# Patient Record
Sex: Male | Born: 1935 | Race: White | Hispanic: No | Marital: Married | Smoking: Former smoker
Health system: Southern US, Community
[De-identification: ages and names within clinical notes are randomized; demographics above are authoritative.]

## PROBLEM LIST (undated history)

## (undated) DIAGNOSIS — I10 Essential (primary) hypertension: Secondary | ICD-10-CM

## (undated) DIAGNOSIS — N400 Enlarged prostate without lower urinary tract symptoms: Secondary | ICD-10-CM

## (undated) DIAGNOSIS — M4800 Spinal stenosis, site unspecified: Secondary | ICD-10-CM

## (undated) DIAGNOSIS — I6529 Occlusion and stenosis of unspecified carotid artery: Secondary | ICD-10-CM

## (undated) DIAGNOSIS — I4819 Other persistent atrial fibrillation: Secondary | ICD-10-CM

## (undated) DIAGNOSIS — G5600 Carpal tunnel syndrome, unspecified upper limb: Secondary | ICD-10-CM

## (undated) HISTORY — PX: ROTATOR CUFF REPAIR: SHX139

## (undated) HISTORY — PX: BACK SURGERY: SHX140

## (undated) HISTORY — PX: CARPAL TUNNEL RELEASE: SHX101

## (undated) HISTORY — PX: CAROTID ARTERY - SUBCLAVIAN ARTERY BYPASS GRAFT: SUR178

## (undated) HISTORY — PX: CATARACT EXTRACTION: SUR2

---

## 2016-11-03 DIAGNOSIS — D045 Carcinoma in situ of skin of trunk: Secondary | ICD-10-CM | POA: Diagnosis not present

## 2016-11-03 DIAGNOSIS — L821 Other seborrheic keratosis: Secondary | ICD-10-CM | POA: Diagnosis not present

## 2016-11-03 DIAGNOSIS — Z08 Encounter for follow-up examination after completed treatment for malignant neoplasm: Secondary | ICD-10-CM | POA: Diagnosis not present

## 2016-11-03 DIAGNOSIS — L918 Other hypertrophic disorders of the skin: Secondary | ICD-10-CM | POA: Diagnosis not present

## 2016-11-03 DIAGNOSIS — L570 Actinic keratosis: Secondary | ICD-10-CM | POA: Diagnosis not present

## 2016-11-03 DIAGNOSIS — L814 Other melanin hyperpigmentation: Secondary | ICD-10-CM | POA: Diagnosis not present

## 2016-11-03 DIAGNOSIS — R21 Rash and other nonspecific skin eruption: Secondary | ICD-10-CM | POA: Diagnosis not present

## 2016-11-03 DIAGNOSIS — D2372 Other benign neoplasm of skin of left lower limb, including hip: Secondary | ICD-10-CM | POA: Diagnosis not present

## 2016-11-03 DIAGNOSIS — Z85828 Personal history of other malignant neoplasm of skin: Secondary | ICD-10-CM | POA: Diagnosis not present

## 2016-11-03 DIAGNOSIS — C44329 Squamous cell carcinoma of skin of other parts of face: Secondary | ICD-10-CM | POA: Diagnosis not present

## 2016-11-03 DIAGNOSIS — D485 Neoplasm of uncertain behavior of skin: Secondary | ICD-10-CM | POA: Diagnosis not present

## 2016-12-01 DIAGNOSIS — E782 Mixed hyperlipidemia: Secondary | ICD-10-CM | POA: Diagnosis not present

## 2016-12-04 DIAGNOSIS — I480 Paroxysmal atrial fibrillation: Secondary | ICD-10-CM | POA: Diagnosis not present

## 2016-12-04 DIAGNOSIS — E782 Mixed hyperlipidemia: Secondary | ICD-10-CM | POA: Diagnosis not present

## 2016-12-04 DIAGNOSIS — I10 Essential (primary) hypertension: Secondary | ICD-10-CM | POA: Diagnosis not present

## 2016-12-08 DIAGNOSIS — I10 Essential (primary) hypertension: Secondary | ICD-10-CM | POA: Diagnosis not present

## 2016-12-08 DIAGNOSIS — I480 Paroxysmal atrial fibrillation: Secondary | ICD-10-CM | POA: Diagnosis not present

## 2016-12-08 DIAGNOSIS — F411 Generalized anxiety disorder: Secondary | ICD-10-CM | POA: Diagnosis not present

## 2016-12-08 DIAGNOSIS — Z6825 Body mass index (BMI) 25.0-25.9, adult: Secondary | ICD-10-CM | POA: Diagnosis not present

## 2016-12-08 DIAGNOSIS — Z79899 Other long term (current) drug therapy: Secondary | ICD-10-CM | POA: Diagnosis not present

## 2016-12-08 DIAGNOSIS — E782 Mixed hyperlipidemia: Secondary | ICD-10-CM | POA: Diagnosis not present

## 2016-12-08 DIAGNOSIS — I739 Peripheral vascular disease, unspecified: Secondary | ICD-10-CM | POA: Diagnosis not present

## 2016-12-11 DIAGNOSIS — C44329 Squamous cell carcinoma of skin of other parts of face: Secondary | ICD-10-CM | POA: Diagnosis not present

## 2016-12-18 DIAGNOSIS — C44329 Squamous cell carcinoma of skin of other parts of face: Secondary | ICD-10-CM | POA: Diagnosis not present

## 2016-12-24 DIAGNOSIS — C44311 Basal cell carcinoma of skin of nose: Secondary | ICD-10-CM | POA: Diagnosis not present

## 2016-12-24 DIAGNOSIS — D485 Neoplasm of uncertain behavior of skin: Secondary | ICD-10-CM | POA: Diagnosis not present

## 2016-12-24 DIAGNOSIS — C44529 Squamous cell carcinoma of skin of other part of trunk: Secondary | ICD-10-CM | POA: Diagnosis not present

## 2017-03-30 DIAGNOSIS — M5136 Other intervertebral disc degeneration, lumbar region: Secondary | ICD-10-CM | POA: Diagnosis not present

## 2017-03-30 DIAGNOSIS — M961 Postlaminectomy syndrome, not elsewhere classified: Secondary | ICD-10-CM | POA: Diagnosis not present

## 2017-03-30 DIAGNOSIS — M461 Sacroiliitis, not elsewhere classified: Secondary | ICD-10-CM | POA: Diagnosis not present

## 2017-03-30 DIAGNOSIS — Z79899 Other long term (current) drug therapy: Secondary | ICD-10-CM | POA: Diagnosis not present

## 2017-04-15 DIAGNOSIS — Z23 Encounter for immunization: Secondary | ICD-10-CM | POA: Diagnosis not present

## 2017-04-30 DIAGNOSIS — C44311 Basal cell carcinoma of skin of nose: Secondary | ICD-10-CM | POA: Diagnosis not present

## 2017-05-28 DIAGNOSIS — Z48817 Encounter for surgical aftercare following surgery on the skin and subcutaneous tissue: Secondary | ICD-10-CM | POA: Diagnosis not present

## 2017-05-29 DIAGNOSIS — E782 Mixed hyperlipidemia: Secondary | ICD-10-CM | POA: Diagnosis not present

## 2017-05-29 DIAGNOSIS — Z79899 Other long term (current) drug therapy: Secondary | ICD-10-CM | POA: Diagnosis not present

## 2017-06-03 DIAGNOSIS — I6529 Occlusion and stenosis of unspecified carotid artery: Secondary | ICD-10-CM | POA: Diagnosis not present

## 2017-06-03 DIAGNOSIS — I10 Essential (primary) hypertension: Secondary | ICD-10-CM | POA: Diagnosis not present

## 2017-06-03 DIAGNOSIS — I480 Paroxysmal atrial fibrillation: Secondary | ICD-10-CM | POA: Diagnosis not present

## 2017-06-03 DIAGNOSIS — E782 Mixed hyperlipidemia: Secondary | ICD-10-CM | POA: Diagnosis not present

## 2017-06-05 DIAGNOSIS — N529 Male erectile dysfunction, unspecified: Secondary | ICD-10-CM | POA: Diagnosis not present

## 2017-06-05 DIAGNOSIS — N401 Enlarged prostate with lower urinary tract symptoms: Secondary | ICD-10-CM | POA: Diagnosis not present

## 2017-06-05 DIAGNOSIS — R3914 Feeling of incomplete bladder emptying: Secondary | ICD-10-CM | POA: Diagnosis not present

## 2017-06-05 DIAGNOSIS — N411 Chronic prostatitis: Secondary | ICD-10-CM | POA: Diagnosis not present

## 2017-06-08 DIAGNOSIS — I10 Essential (primary) hypertension: Secondary | ICD-10-CM | POA: Diagnosis not present

## 2017-06-08 DIAGNOSIS — E782 Mixed hyperlipidemia: Secondary | ICD-10-CM | POA: Diagnosis not present

## 2017-06-08 DIAGNOSIS — I480 Paroxysmal atrial fibrillation: Secondary | ICD-10-CM | POA: Diagnosis not present

## 2017-06-08 DIAGNOSIS — Z6826 Body mass index (BMI) 26.0-26.9, adult: Secondary | ICD-10-CM | POA: Diagnosis not present

## 2017-09-10 DIAGNOSIS — N401 Enlarged prostate with lower urinary tract symptoms: Secondary | ICD-10-CM | POA: Diagnosis not present

## 2017-09-10 DIAGNOSIS — M25511 Pain in right shoulder: Secondary | ICD-10-CM | POA: Diagnosis not present

## 2017-09-10 DIAGNOSIS — I10 Essential (primary) hypertension: Secondary | ICD-10-CM | POA: Diagnosis not present

## 2017-09-10 DIAGNOSIS — E7849 Other hyperlipidemia: Secondary | ICD-10-CM | POA: Diagnosis not present

## 2017-09-10 DIAGNOSIS — I6521 Occlusion and stenosis of right carotid artery: Secondary | ICD-10-CM | POA: Diagnosis not present

## 2017-09-10 DIAGNOSIS — G8929 Other chronic pain: Secondary | ICD-10-CM | POA: Diagnosis not present

## 2017-09-10 DIAGNOSIS — Z87438 Personal history of other diseases of male genital organs: Secondary | ICD-10-CM | POA: Diagnosis not present

## 2017-09-10 DIAGNOSIS — I481 Persistent atrial fibrillation: Secondary | ICD-10-CM | POA: Diagnosis not present

## 2017-09-15 DIAGNOSIS — M12811 Other specific arthropathies, not elsewhere classified, right shoulder: Secondary | ICD-10-CM | POA: Diagnosis not present

## 2017-09-15 DIAGNOSIS — G8929 Other chronic pain: Secondary | ICD-10-CM | POA: Diagnosis not present

## 2017-09-15 DIAGNOSIS — M25511 Pain in right shoulder: Secondary | ICD-10-CM | POA: Diagnosis not present

## 2017-09-22 DIAGNOSIS — M65311 Trigger thumb, right thumb: Secondary | ICD-10-CM | POA: Diagnosis not present

## 2017-09-22 DIAGNOSIS — M12811 Other specific arthropathies, not elsewhere classified, right shoulder: Secondary | ICD-10-CM | POA: Diagnosis not present

## 2017-10-20 DIAGNOSIS — M65311 Trigger thumb, right thumb: Secondary | ICD-10-CM | POA: Diagnosis not present

## 2017-10-20 DIAGNOSIS — M12811 Other specific arthropathies, not elsewhere classified, right shoulder: Secondary | ICD-10-CM | POA: Diagnosis not present

## 2017-10-21 DIAGNOSIS — I4891 Unspecified atrial fibrillation: Secondary | ICD-10-CM | POA: Diagnosis not present

## 2017-10-21 DIAGNOSIS — I10 Essential (primary) hypertension: Secondary | ICD-10-CM | POA: Diagnosis not present

## 2017-10-21 DIAGNOSIS — Z9889 Other specified postprocedural states: Secondary | ICD-10-CM | POA: Diagnosis not present

## 2017-10-21 DIAGNOSIS — I481 Persistent atrial fibrillation: Secondary | ICD-10-CM | POA: Diagnosis not present

## 2017-10-26 DIAGNOSIS — N401 Enlarged prostate with lower urinary tract symptoms: Secondary | ICD-10-CM | POA: Diagnosis not present

## 2017-10-26 DIAGNOSIS — N529 Male erectile dysfunction, unspecified: Secondary | ICD-10-CM | POA: Diagnosis not present

## 2017-10-26 DIAGNOSIS — Z87438 Personal history of other diseases of male genital organs: Secondary | ICD-10-CM | POA: Diagnosis not present

## 2017-10-26 DIAGNOSIS — N138 Other obstructive and reflux uropathy: Secondary | ICD-10-CM | POA: Diagnosis not present

## 2017-12-07 DIAGNOSIS — J209 Acute bronchitis, unspecified: Secondary | ICD-10-CM | POA: Diagnosis not present

## 2017-12-11 DIAGNOSIS — N401 Enlarged prostate with lower urinary tract symptoms: Secondary | ICD-10-CM | POA: Diagnosis not present

## 2017-12-11 DIAGNOSIS — I481 Persistent atrial fibrillation: Secondary | ICD-10-CM | POA: Diagnosis not present

## 2017-12-11 DIAGNOSIS — Z0000 Encounter for general adult medical examination without abnormal findings: Secondary | ICD-10-CM | POA: Diagnosis not present

## 2017-12-11 DIAGNOSIS — I6521 Occlusion and stenosis of right carotid artery: Secondary | ICD-10-CM | POA: Diagnosis not present

## 2017-12-11 DIAGNOSIS — I10 Essential (primary) hypertension: Secondary | ICD-10-CM | POA: Diagnosis not present

## 2017-12-11 DIAGNOSIS — E7849 Other hyperlipidemia: Secondary | ICD-10-CM | POA: Diagnosis not present

## 2018-01-19 DIAGNOSIS — M545 Low back pain: Secondary | ICD-10-CM | POA: Diagnosis not present

## 2018-01-19 DIAGNOSIS — M461 Sacroiliitis, not elsewhere classified: Secondary | ICD-10-CM | POA: Diagnosis not present

## 2018-01-19 DIAGNOSIS — M961 Postlaminectomy syndrome, not elsewhere classified: Secondary | ICD-10-CM | POA: Diagnosis not present

## 2018-01-19 DIAGNOSIS — G8929 Other chronic pain: Secondary | ICD-10-CM | POA: Diagnosis not present

## 2018-01-19 DIAGNOSIS — I700 Atherosclerosis of aorta: Secondary | ICD-10-CM | POA: Diagnosis not present

## 2018-01-19 DIAGNOSIS — M5136 Other intervertebral disc degeneration, lumbar region: Secondary | ICD-10-CM | POA: Diagnosis not present

## 2018-01-19 DIAGNOSIS — M47816 Spondylosis without myelopathy or radiculopathy, lumbar region: Secondary | ICD-10-CM | POA: Diagnosis not present

## 2018-01-26 DIAGNOSIS — M461 Sacroiliitis, not elsewhere classified: Secondary | ICD-10-CM | POA: Diagnosis not present

## 2018-01-28 DIAGNOSIS — Z85828 Personal history of other malignant neoplasm of skin: Secondary | ICD-10-CM | POA: Diagnosis not present

## 2018-01-28 DIAGNOSIS — Z9889 Other specified postprocedural states: Secondary | ICD-10-CM | POA: Diagnosis not present

## 2018-01-28 DIAGNOSIS — D1801 Hemangioma of skin and subcutaneous tissue: Secondary | ICD-10-CM | POA: Diagnosis not present

## 2018-01-28 DIAGNOSIS — L570 Actinic keratosis: Secondary | ICD-10-CM | POA: Diagnosis not present

## 2018-01-28 DIAGNOSIS — L821 Other seborrheic keratosis: Secondary | ICD-10-CM | POA: Diagnosis not present

## 2018-02-07 DIAGNOSIS — B029 Zoster without complications: Secondary | ICD-10-CM | POA: Diagnosis not present

## 2018-02-09 DIAGNOSIS — I482 Chronic atrial fibrillation: Secondary | ICD-10-CM | POA: Diagnosis not present

## 2018-02-09 DIAGNOSIS — M25522 Pain in left elbow: Secondary | ICD-10-CM | POA: Diagnosis not present

## 2018-02-09 DIAGNOSIS — B029 Zoster without complications: Secondary | ICD-10-CM | POA: Diagnosis not present

## 2018-02-09 DIAGNOSIS — M7522 Bicipital tendinitis, left shoulder: Secondary | ICD-10-CM | POA: Diagnosis not present

## 2018-02-12 DIAGNOSIS — N401 Enlarged prostate with lower urinary tract symptoms: Secondary | ICD-10-CM | POA: Diagnosis not present

## 2018-02-18 DIAGNOSIS — I8393 Asymptomatic varicose veins of bilateral lower extremities: Secondary | ICD-10-CM | POA: Diagnosis not present

## 2018-02-18 DIAGNOSIS — G8929 Other chronic pain: Secondary | ICD-10-CM | POA: Diagnosis not present

## 2018-02-18 DIAGNOSIS — I10 Essential (primary) hypertension: Secondary | ICD-10-CM | POA: Diagnosis not present

## 2018-02-18 DIAGNOSIS — N400 Enlarged prostate without lower urinary tract symptoms: Secondary | ICD-10-CM | POA: Diagnosis not present

## 2018-02-18 DIAGNOSIS — M5441 Lumbago with sciatica, right side: Secondary | ICD-10-CM | POA: Diagnosis not present

## 2018-02-18 DIAGNOSIS — I779 Disorder of arteries and arterioles, unspecified: Secondary | ICD-10-CM | POA: Diagnosis not present

## 2018-02-18 DIAGNOSIS — I4891 Unspecified atrial fibrillation: Secondary | ICD-10-CM | POA: Diagnosis not present

## 2018-03-09 DIAGNOSIS — M12811 Other specific arthropathies, not elsewhere classified, right shoulder: Secondary | ICD-10-CM | POA: Diagnosis not present

## 2018-03-18 DIAGNOSIS — Z23 Encounter for immunization: Secondary | ICD-10-CM | POA: Diagnosis not present

## 2018-04-06 DIAGNOSIS — L905 Scar conditions and fibrosis of skin: Secondary | ICD-10-CM | POA: Diagnosis not present

## 2018-04-06 DIAGNOSIS — L219 Seborrheic dermatitis, unspecified: Secondary | ICD-10-CM | POA: Diagnosis not present

## 2018-04-06 DIAGNOSIS — L111 Transient acantholytic dermatosis [Grover]: Secondary | ICD-10-CM | POA: Diagnosis not present

## 2018-04-06 DIAGNOSIS — Z23 Encounter for immunization: Secondary | ICD-10-CM | POA: Diagnosis not present

## 2018-04-06 DIAGNOSIS — C44619 Basal cell carcinoma of skin of left upper limb, including shoulder: Secondary | ICD-10-CM | POA: Diagnosis not present

## 2018-04-06 DIAGNOSIS — C4441 Basal cell carcinoma of skin of scalp and neck: Secondary | ICD-10-CM | POA: Diagnosis not present

## 2018-04-06 DIAGNOSIS — D0439 Carcinoma in situ of skin of other parts of face: Secondary | ICD-10-CM | POA: Diagnosis not present

## 2018-04-06 DIAGNOSIS — D485 Neoplasm of uncertain behavior of skin: Secondary | ICD-10-CM | POA: Diagnosis not present

## 2018-04-07 ENCOUNTER — Encounter (HOSPITAL_COMMUNITY): Payer: Self-pay | Admitting: Nurse Practitioner

## 2018-04-07 ENCOUNTER — Ambulatory Visit (HOSPITAL_COMMUNITY)
Admission: RE | Admit: 2018-04-07 | Discharge: 2018-04-07 | Disposition: A | Discharge status: Home / Self Care | Payer: Medicare Other | Source: Ambulatory Visit | Attending: Nurse Practitioner | Admitting: Nurse Practitioner

## 2018-04-07 VITALS — BP 136/84 | HR 75 | Ht 71.0 in | Wt 174.0 lb

## 2018-04-07 DIAGNOSIS — Z79899 Other long term (current) drug therapy: Secondary | ICD-10-CM | POA: Insufficient documentation

## 2018-04-07 DIAGNOSIS — I4811 Longstanding persistent atrial fibrillation: Secondary | ICD-10-CM

## 2018-04-07 DIAGNOSIS — G5600 Carpal tunnel syndrome, unspecified upper limb: Secondary | ICD-10-CM | POA: Diagnosis not present

## 2018-04-07 DIAGNOSIS — Z9889 Other specified postprocedural states: Secondary | ICD-10-CM | POA: Diagnosis not present

## 2018-04-07 DIAGNOSIS — I4819 Other persistent atrial fibrillation: Secondary | ICD-10-CM | POA: Insufficient documentation

## 2018-04-07 DIAGNOSIS — I10 Essential (primary) hypertension: Secondary | ICD-10-CM | POA: Diagnosis not present

## 2018-04-07 DIAGNOSIS — Z87891 Personal history of nicotine dependence: Secondary | ICD-10-CM | POA: Insufficient documentation

## 2018-04-07 HISTORY — DX: Carpal tunnel syndrome, unspecified upper limb: G56.00

## 2018-04-07 HISTORY — DX: Spinal stenosis, site unspecified: M48.00

## 2018-04-07 HISTORY — DX: Occlusion and stenosis of unspecified carotid artery: I65.29

## 2018-04-07 HISTORY — DX: Benign prostatic hyperplasia without lower urinary tract symptoms: N40.0

## 2018-04-07 HISTORY — DX: Essential (primary) hypertension: I10

## 2018-04-07 HISTORY — DX: Other persistent atrial fibrillation: I48.19

## 2018-04-07 NOTE — Progress Notes (Signed)
Primary Care Physician: Harlan Stains Referring Physician: Harlan Stains, MD   Edgar Wilkerson is a 84 y.o. male with a history of asymptomatic persistent atrial fibrillation who presents for consultation in the [facility].  The patient was initially diagnosed with atrial fibrillation in 2014 and has been on anticoagulation with Eliquis since. He and his wife moved here from [facility] recently. He originally saw cardiology at [facility] but did not feel they had a good relationship and was referred to the AF clinic for management.  [place] 4-5 miles per day. He is not able to do that in his current space 2/2 not being close to trails.  He also has been under significant stress recently with building a townhome.  He has very little awareness of his atrial fibrillation. He is looking forward to building relationships with friends in his community.   Today, he denies symptoms of palpitations, chest pain, shortness of breath, orthopnea, PND, lower extremity edema, dizziness, presyncope, syncope, snoring, daytime somnolence, bleeding, or neurologic sequela. The patient is tolerating medications without difficulties and is otherwise without complaint today.    Atrial Fibrillation Risk Factors:  he does not have symptoms or diagnosis of sleep apnea.  he does not have a history of rheumatic fever.  he does not have a history of alcohol use.  he has a BMI of Body mass index is 24.27 kg/m.Marland Kitchen Filed Weights   04/07/18 1032  Weight: 78.9 kg     Atrial Fibrillation Management history:  Previous antiarrhythmic drugs: none  Previous cardioversions: none  Previous ablations: none  CHADS2VASC score: 4  Anticoagulation history: Eliquis   Past Medical History:  Diagnosis Date  . BPH (benign prostatic hyperplasia)   . Carotid artery stenosis    a. s/p R CEA  . Carpal tunnel syndrome   .  Hypertension   . Persistent atrial fibrillation   . Spinal stenosis    Past Surgical History:  Procedure Laterality Date  . BACK SURGERY Bilateral    x 3   . CAROTID ARTERY - SUBCLAVIAN ARTERY BYPASS GRAFT    . CARPAL TUNNEL RELEASE    . CATARACT EXTRACTION    . ROTATOR CUFF REPAIR      Current Outpatient Medications  Medication Sig Dispense Refill  . apixaban (ELIQUIS) 5 MG TABS tablet Take 1 tablet by mouth 2 (two) times daily.    . carvedilol (COREG) 12.5 MG tablet Take 1 tablet by mouth 2 (two) times daily.    Marland Kitchen lisinopril (PRINIVIL,ZESTRIL) 10 MG tablet Take 1 tablet by mouth daily.    . simvastatin (ZOCOR) 20 MG tablet Take 1 tablet by mouth daily.    . tamsulosin (FLOMAX) 0.4 MG CAPS capsule Take 0.4 mg by mouth daily.     No current facility-administered medications for this encounter.     No Known Allergies  Social History   Socioeconomic History  . Marital status: Unknown    Spouse name: Not on file  . Number of children: Not on file  . Years of education: Not on file  . Highest education level: Not on file  Occupational History  . Not on file  Social Needs  . Financial resource strain: Not on file  . Food insecurity:    Worry: Not on file    Inability: Not on file  . Transportation needs:    Medical: Not on file  Non-medical: Not on file  Tobacco Use  . Smoking status: Former Research scientist (life sciences)  . Smokeless tobacco: Never Used  Substance and Sexual Activity  . Alcohol use: Yes    Alcohol/week: 5.0 standard drinks    Types: 5 Glasses of wine per week  . Drug use: Not on file  . Sexual activity: Not on file  Lifestyle  . Physical activity:    Days per week: Not on file    Minutes per session: Not on file  . Stress: Not on file  Relationships  . Social connections:    Talks on phone: Not on file    Gets together: Not on file    Attends religious service: Not on file    Active member of club or organization: Not on file    Attends meetings of clubs or  organizations: Not on file    Relationship status: Not on file  . Intimate partner violence:    Fear of current or ex partner: Not on file    Emotionally abused: Not on file    Physically abused: Not on file    Forced sexual activity: Not on file  Other Topics Concern  . Not on file  Social History Narrative  . Not on file    No family history on file. The patient does not have a history of early familial atrial fibrillation or other arrhythmias.  ROS- All systems are reviewed and negative except as per the HPI above.  Physical Exam: Vitals:   04/07/18 1032  BP: 136/84  Pulse: 75  Weight: 78.9 kg  Height: 5\' 11"  (1.803 m)    GEN- The patient is well appearing, alert and oriented x 3 today.   Head- normocephalic, atraumatic Eyes-  Sclera clear, conjunctiva pink Ears- hearing intact Oropharynx- clear Neck- supple  Lungs- Clear to ausculation bilaterally, normal work of breathing Heart- Regular rate and rhythm, no murmurs, rubs or gallops  GI- soft, NT, ND, + BS Extremities- no clubbing, cyanosis, or edema MS- no significant deformity or atrophy Skin- no rash or lesion Psych- euthymic mood, full affect Neuro- strength and sensation are intact  Wt Readings from Last 3 Encounters:  04/07/18 78.9 kg    EKG today demonstrates atrial fibrillation, rate 75, normal intervals  Epic records are reviewed at length today  Assessment and Plan:  1. Persistent atrial fibrillation The patient has asymptomatic persistent atrial fibrillation.  He is appropriately anticoagulated with Eliquis at the correct dose.  Labs checked recently by PCP were normal.  As he is asymptomatic and has likely been in AF for 5 years, I do not think pursuing SR makes sense.  Will update echo   2.  HTN Stable No change required today  Follow up with AF clinic in 3 months (he plans to bring his wife to that appointment)   Chanetta Marshall, NP 04/07/2018 12:41 PM

## 2018-04-19 ENCOUNTER — Ambulatory Visit (HOSPITAL_COMMUNITY)
Admission: RE | Admit: 2018-04-19 | Discharge: 2018-04-19 | Disposition: A | Discharge status: Home / Self Care | Payer: Medicare Other | Source: Ambulatory Visit | Attending: Nurse Practitioner | Admitting: Nurse Practitioner

## 2018-04-19 DIAGNOSIS — I4891 Unspecified atrial fibrillation: Secondary | ICD-10-CM | POA: Diagnosis not present

## 2018-04-19 DIAGNOSIS — I340 Nonrheumatic mitral (valve) insufficiency: Secondary | ICD-10-CM | POA: Insufficient documentation

## 2018-04-19 DIAGNOSIS — I4819 Other persistent atrial fibrillation: Secondary | ICD-10-CM | POA: Diagnosis not present

## 2018-04-19 DIAGNOSIS — I10 Essential (primary) hypertension: Secondary | ICD-10-CM | POA: Diagnosis not present

## 2018-04-19 NOTE — Progress Notes (Signed)
  Echocardiogram 2D Echocardiogram has been performed.  Edgar Wilkerson 04/19/2018, 4:00 PM

## 2018-04-20 ENCOUNTER — Encounter (HOSPITAL_COMMUNITY): Payer: Self-pay | Admitting: *Deleted

## 2018-04-21 DIAGNOSIS — S0100XA Unspecified open wound of scalp, initial encounter: Secondary | ICD-10-CM | POA: Diagnosis not present

## 2018-04-21 DIAGNOSIS — C4441 Basal cell carcinoma of skin of scalp and neck: Secondary | ICD-10-CM | POA: Diagnosis not present

## 2018-05-18 DIAGNOSIS — C4441 Basal cell carcinoma of skin of scalp and neck: Secondary | ICD-10-CM | POA: Diagnosis not present

## 2018-05-31 DIAGNOSIS — J069 Acute upper respiratory infection, unspecified: Secondary | ICD-10-CM | POA: Diagnosis not present

## 2018-05-31 DIAGNOSIS — R05 Cough: Secondary | ICD-10-CM | POA: Diagnosis not present

## 2018-06-02 DIAGNOSIS — C4441 Basal cell carcinoma of skin of scalp and neck: Secondary | ICD-10-CM | POA: Diagnosis not present

## 2018-06-16 DIAGNOSIS — R05 Cough: Secondary | ICD-10-CM | POA: Diagnosis not present

## 2018-07-03 DIAGNOSIS — R05 Cough: Secondary | ICD-10-CM | POA: Diagnosis not present

## 2018-07-05 ENCOUNTER — Other Ambulatory Visit: Payer: Self-pay | Admitting: Family Medicine

## 2018-07-05 ENCOUNTER — Ambulatory Visit
Admission: RE | Admit: 2018-07-05 | Discharge: 2018-07-05 | Disposition: A | Discharge status: Home / Self Care | Payer: Medicare Other | Source: Ambulatory Visit | Attending: Family Medicine | Admitting: Family Medicine

## 2018-07-05 DIAGNOSIS — R0689 Other abnormalities of breathing: Secondary | ICD-10-CM

## 2018-07-05 DIAGNOSIS — R509 Fever, unspecified: Secondary | ICD-10-CM | POA: Diagnosis not present

## 2018-07-05 DIAGNOSIS — R059 Cough, unspecified: Secondary | ICD-10-CM

## 2018-07-05 DIAGNOSIS — J40 Bronchitis, not specified as acute or chronic: Secondary | ICD-10-CM | POA: Diagnosis not present

## 2018-07-05 DIAGNOSIS — R05 Cough: Secondary | ICD-10-CM

## 2018-07-13 ENCOUNTER — Encounter (HOSPITAL_COMMUNITY): Payer: Self-pay | Admitting: Nurse Practitioner

## 2018-07-13 ENCOUNTER — Ambulatory Visit (HOSPITAL_COMMUNITY)
Admission: RE | Admit: 2018-07-13 | Discharge: 2018-07-13 | Disposition: A | Discharge status: Home / Self Care | Payer: Medicare Other | Source: Ambulatory Visit | Attending: Nurse Practitioner | Admitting: Nurse Practitioner

## 2018-07-13 VITALS — BP 126/80 | HR 73 | Ht 71.0 in | Wt 179.0 lb

## 2018-07-13 DIAGNOSIS — I6529 Occlusion and stenosis of unspecified carotid artery: Secondary | ICD-10-CM | POA: Insufficient documentation

## 2018-07-13 DIAGNOSIS — I10 Essential (primary) hypertension: Secondary | ICD-10-CM | POA: Diagnosis not present

## 2018-07-13 DIAGNOSIS — Z79899 Other long term (current) drug therapy: Secondary | ICD-10-CM | POA: Diagnosis not present

## 2018-07-13 DIAGNOSIS — Z7901 Long term (current) use of anticoagulants: Secondary | ICD-10-CM | POA: Diagnosis not present

## 2018-07-13 DIAGNOSIS — I4821 Permanent atrial fibrillation: Secondary | ICD-10-CM

## 2018-07-13 DIAGNOSIS — Z87891 Personal history of nicotine dependence: Secondary | ICD-10-CM | POA: Diagnosis not present

## 2018-07-13 DIAGNOSIS — I4819 Other persistent atrial fibrillation: Secondary | ICD-10-CM | POA: Diagnosis not present

## 2018-07-13 DIAGNOSIS — N400 Enlarged prostate without lower urinary tract symptoms: Secondary | ICD-10-CM | POA: Diagnosis not present

## 2018-07-13 DIAGNOSIS — G5600 Carpal tunnel syndrome, unspecified upper limb: Secondary | ICD-10-CM | POA: Insufficient documentation

## 2018-07-13 LAB — CBC
HCT: 42.9 % (ref 39.0–52.0)
Hemoglobin: 14.2 g/dL (ref 13.0–17.0)
MCH: 33.7 pg (ref 26.0–34.0)
MCHC: 33.1 g/dL (ref 30.0–36.0)
MCV: 101.9 fL — ABNORMAL HIGH (ref 80.0–100.0)
NRBC: 0 % (ref 0.0–0.2)
PLATELETS: 216 10*3/uL (ref 150–400)
RBC: 4.21 MIL/uL — ABNORMAL LOW (ref 4.22–5.81)
RDW: 11.9 % (ref 11.5–15.5)
WBC: 7 10*3/uL (ref 4.0–10.5)

## 2018-07-13 LAB — BASIC METABOLIC PANEL
ANION GAP: 8 (ref 5–15)
BUN: 18 mg/dL (ref 8–23)
CALCIUM: 9 mg/dL (ref 8.9–10.3)
CO2: 26 mmol/L (ref 22–32)
Chloride: 104 mmol/L (ref 98–111)
Creatinine, Ser: 1.24 mg/dL (ref 0.61–1.24)
GFR, EST NON AFRICAN AMERICAN: 52 mL/min — AB (ref >60)
Glucose, Bld: 91 mg/dL (ref 70–99)
Potassium: 4.5 mmol/L (ref 3.5–5.1)
Sodium: 138 mmol/L (ref 135–145)

## 2018-07-13 NOTE — Progress Notes (Signed)
Primary Care Physician: Harlan Stains Referring Physician: Harlan Stains, MD   Edgar Wilkerson is a 85 y.o. male with a history of asymptomatic persistent atrial fibrillation who presents for f/u in the [facility].  The patient was initially diagnosed with atrial fibrillation in 2014 and has been on anticoagulation with Eliquis since. He and his wife moved here from [facility] early in the year. He originally saw cardiology at [facility] but did not feel they had a good relationship and was referred to the AF clinic for management.  [place] his bike 4-5 miles per day, ha has not continued this here as he does not have a good as a place to ride. He hopes to be able to play more golf when the weather improves.He has very little awareness of his atrial fibrillation. Continues on eliquis. Some bruising but no bleeding issues.   Today, he denies symptoms of palpitations, chest pain, shortness of breath, orthopnea, PND, lower extremity edema, dizziness, presyncope, syncope, snoring, daytime somnolence, bleeding, or neurologic sequela. The patient is tolerating medications without difficulties and is otherwise without complaint today.    Atrial Fibrillation Risk Factors:  he does not have symptoms or diagnosis of sleep apnea.  he does not have a history of rheumatic fever.  he does not have a history of alcohol use.  he has a BMI of Body mass index is 24.97 kg/m.Marland Kitchen Filed Weights   07/13/18 1107  Weight: 81.2 kg     Atrial Fibrillation Management history:  Previous antiarrhythmic drugs: none  Previous cardioversions: none  Previous ablations: none  CHADS2VASC score: 4  Anticoagulation history: Eliquis   Past Medical History:  Diagnosis Date  . BPH (benign prostatic hyperplasia)   . Carotid artery stenosis    a. s/p R CEA  . Carpal tunnel syndrome   . Hypertension   . Persistent atrial  fibrillation   . Spinal stenosis    Past Surgical History:  Procedure Laterality Date  . BACK SURGERY Bilateral    x 3   . CAROTID ARTERY - SUBCLAVIAN ARTERY BYPASS GRAFT    . CARPAL TUNNEL RELEASE    . CATARACT EXTRACTION    . ROTATOR CUFF REPAIR      Current Outpatient Medications  Medication Sig Dispense Refill  . apixaban (ELIQUIS) 5 MG TABS tablet Take 1 tablet by mouth 2 (two) times daily.    . carvedilol (COREG) 12.5 MG tablet Take 1 tablet by mouth 2 (two) times daily.    Marland Kitchen lisinopril (PRINIVIL,ZESTRIL) 10 MG tablet Take 1 tablet by mouth daily.    . Multiple Vitamins-Minerals (AIRBORNE PO) Take by mouth.    . simvastatin (ZOCOR) 20 MG tablet Take 1 tablet by mouth daily.    . tamsulosin (FLOMAX) 0.4 MG CAPS capsule Take 0.4 mg by mouth daily.     No current facility-administered medications for this encounter.     No Known Allergies  Social History   Socioeconomic History  . Marital status: Unknown    Spouse name: Not on file  . Number of children: Not on file  . Years of education: Not on file  . Highest education level: Not on file  Occupational History  . Not on file  Social Needs  . Financial resource strain: Not on file  . Food insecurity:    Worry: Not on file    Inability: Not on file  . Transportation  needs:    Medical: Not on file    Non-medical: Not on file  Tobacco Use  . Smoking status: Former Research scientist (life sciences)  . Smokeless tobacco: Never Used  Substance and Sexual Activity  . Alcohol use: Yes    Alcohol/week: 5.0 standard drinks    Types: 5 Glasses of wine per week  . Drug use: Not on file  . Sexual activity: Not on file  Lifestyle  . Physical activity:    Days per week: Not on file    Minutes per session: Not on file  . Stress: Not on file  Relationships  . Social connections:    Talks on phone: Not on file    Gets together: Not on file    Attends religious service: Not on file    Active member of club or organization: Not on file     Attends meetings of clubs or organizations: Not on file    Relationship status: Not on file  . Intimate partner violence:    Fear of current or ex partner: Not on file    Emotionally abused: Not on file    Physically abused: Not on file    Forced sexual activity: Not on file  Other Topics Concern  . Not on file  Social History Narrative  . Not on file    No family history on file. The patient does not have a history of early familial atrial fibrillation or other arrhythmias.  ROS- All systems are reviewed and negative except as per the HPI above.  Physical Exam: Vitals:   07/13/18 1107  BP: 126/80  Pulse: 73  Weight: 81.2 kg  Height: 5\' 11"  (1.803 m)    GEN- The patient is well appearing, alert and oriented x 3 today.   Head- normocephalic, atraumatic Eyes-  Sclera clear, conjunctiva pink Ears- hearing intact Oropharynx- clear Neck- supple  Lungs- Clear to ausculation bilaterally, normal work of breathing Heart- Regular rate and rhythm, no murmurs, rubs or gallops  GI- soft, NT, ND, + BS Extremities- no clubbing, cyanosis, or edema MS- no significant deformity or atrophy Skin- no rash or lesion Psych- euthymic mood, full affect Neuro- strength and sensation are intact  Wt Readings from Last 3 Encounters:  07/13/18 81.2 kg  04/07/18 78.9 kg    EKG today demonstrates atrial fibrillation, rate 73, normal intervals  Epic records are reviewed at length today  Echo- Study Conclusions  - Left ventricle: The cavity size was normal. There was mild   concentric hypertrophy. Systolic function was normal. The   estimated ejection fraction was in the range of 60% to 65%. Wall   motion was normal; there were no regional wall motion   abnormalities. - Aortic valve: Transvalvular velocity was within the normal range.   There was no stenosis. There was no regurgitation. - Mitral valve: Transvalvular velocity was within the normal range.   There was no evidence for  stenosis. There was mild regurgitation.   Valve area by continuity equation (using LVOT flow): 2.26 cm^2. - Left atrium: The atrium was moderately dilated. - Right ventricle: The cavity size was normal. Wall thickness was   normal. Systolic function was normal. - Right atrium: The atrium was mildly dilated. - Tricuspid valve: There was trivial regurgitation. - Pulmonary arteries: Systolic pressure was within the normal   range. PA peak pressure: 27 mm Hg (S). - Pericardium, extracardiac: A trivial pericardial effusion was   identified. - Global longitudinal strain -18.1% (normal).  Assessment and Plan:  1. Persistent atrial fibrillation The patient has asymptomatic persistent atrial fibrillation.  He is appropriately anticoagulated with Eliquis at the correct dose.   As he is asymptomatic and has likely been in AF for 5 years, rate control is his long term approach Cbc/bmet today   2.  HTN Stable No change required today  Follow up with AF clinic in 6 months   [place]. Kariah Loredo, [facility] [address] [place], [place] [address] [phone]

## 2018-07-19 ENCOUNTER — Telehealth: Payer: Self-pay

## 2018-07-19 NOTE — Telephone Encounter (Signed)
Spoke to patient who asked through My Chart about his lab results drawn recently.  I shared Donna's comments/recommendations with him.  He was thankful for the call and verbalized understanding.

## 2018-08-03 DIAGNOSIS — C44619 Basal cell carcinoma of skin of left upper limb, including shoulder: Secondary | ICD-10-CM | POA: Diagnosis not present

## 2018-08-10 DIAGNOSIS — M12811 Other specific arthropathies, not elsewhere classified, right shoulder: Secondary | ICD-10-CM | POA: Diagnosis not present

## 2018-08-25 DIAGNOSIS — M961 Postlaminectomy syndrome, not elsewhere classified: Secondary | ICD-10-CM | POA: Diagnosis not present

## 2018-08-25 DIAGNOSIS — M47816 Spondylosis without myelopathy or radiculopathy, lumbar region: Secondary | ICD-10-CM | POA: Diagnosis not present

## 2018-09-09 DIAGNOSIS — M47817 Spondylosis without myelopathy or radiculopathy, lumbosacral region: Secondary | ICD-10-CM | POA: Diagnosis not present

## 2018-09-09 DIAGNOSIS — I10 Essential (primary) hypertension: Secondary | ICD-10-CM | POA: Diagnosis not present

## 2018-09-09 DIAGNOSIS — Z87891 Personal history of nicotine dependence: Secondary | ICD-10-CM | POA: Diagnosis not present

## 2018-09-09 DIAGNOSIS — I4891 Unspecified atrial fibrillation: Secondary | ICD-10-CM | POA: Diagnosis not present

## 2018-09-13 DIAGNOSIS — N183 Chronic kidney disease, stage 3 (moderate): Secondary | ICD-10-CM | POA: Diagnosis not present

## 2018-09-13 DIAGNOSIS — I129 Hypertensive chronic kidney disease with stage 1 through stage 4 chronic kidney disease, or unspecified chronic kidney disease: Secondary | ICD-10-CM | POA: Diagnosis not present

## 2018-09-13 DIAGNOSIS — E785 Hyperlipidemia, unspecified: Secondary | ICD-10-CM | POA: Diagnosis not present

## 2018-09-13 DIAGNOSIS — Z23 Encounter for immunization: Secondary | ICD-10-CM | POA: Diagnosis not present

## 2018-09-13 DIAGNOSIS — I4891 Unspecified atrial fibrillation: Secondary | ICD-10-CM | POA: Diagnosis not present

## 2018-09-13 DIAGNOSIS — N400 Enlarged prostate without lower urinary tract symptoms: Secondary | ICD-10-CM | POA: Diagnosis not present

## 2018-09-13 DIAGNOSIS — L729 Follicular cyst of the skin and subcutaneous tissue, unspecified: Secondary | ICD-10-CM | POA: Diagnosis not present

## 2018-09-13 DIAGNOSIS — Z0000 Encounter for general adult medical examination without abnormal findings: Secondary | ICD-10-CM | POA: Diagnosis not present

## 2018-09-13 DIAGNOSIS — G250 Essential tremor: Secondary | ICD-10-CM | POA: Diagnosis not present

## 2018-10-28 DIAGNOSIS — M47817 Spondylosis without myelopathy or radiculopathy, lumbosacral region: Secondary | ICD-10-CM | POA: Diagnosis not present

## 2018-12-17 DIAGNOSIS — C4442 Squamous cell carcinoma of skin of scalp and neck: Secondary | ICD-10-CM | POA: Diagnosis not present

## 2018-12-17 DIAGNOSIS — C44629 Squamous cell carcinoma of skin of left upper limb, including shoulder: Secondary | ICD-10-CM | POA: Diagnosis not present

## 2018-12-17 DIAGNOSIS — L814 Other melanin hyperpigmentation: Secondary | ICD-10-CM | POA: Diagnosis not present

## 2018-12-17 DIAGNOSIS — D485 Neoplasm of uncertain behavior of skin: Secondary | ICD-10-CM | POA: Diagnosis not present

## 2018-12-17 DIAGNOSIS — D692 Other nonthrombocytopenic purpura: Secondary | ICD-10-CM | POA: Diagnosis not present

## 2018-12-17 DIAGNOSIS — L821 Other seborrheic keratosis: Secondary | ICD-10-CM | POA: Diagnosis not present

## 2018-12-17 DIAGNOSIS — C44519 Basal cell carcinoma of skin of other part of trunk: Secondary | ICD-10-CM | POA: Diagnosis not present

## 2018-12-17 DIAGNOSIS — Z85828 Personal history of other malignant neoplasm of skin: Secondary | ICD-10-CM | POA: Diagnosis not present

## 2018-12-17 DIAGNOSIS — L918 Other hypertrophic disorders of the skin: Secondary | ICD-10-CM | POA: Diagnosis not present

## 2019-01-11 ENCOUNTER — Other Ambulatory Visit: Payer: Self-pay

## 2019-01-11 ENCOUNTER — Encounter (HOSPITAL_COMMUNITY): Payer: Self-pay | Admitting: Nurse Practitioner

## 2019-01-11 ENCOUNTER — Ambulatory Visit (HOSPITAL_COMMUNITY)
Admission: RE | Admit: 2019-01-11 | Discharge: 2019-01-11 | Disposition: A | Discharge status: Home / Self Care | Payer: Medicare Other | Source: Ambulatory Visit | Attending: Nurse Practitioner | Admitting: Nurse Practitioner

## 2019-01-11 VITALS — BP 138/72 | HR 63 | Ht 71.0 in | Wt 182.0 lb

## 2019-01-11 DIAGNOSIS — Z79899 Other long term (current) drug therapy: Secondary | ICD-10-CM | POA: Diagnosis not present

## 2019-01-11 DIAGNOSIS — Z87891 Personal history of nicotine dependence: Secondary | ICD-10-CM | POA: Insufficient documentation

## 2019-01-11 DIAGNOSIS — Z7901 Long term (current) use of anticoagulants: Secondary | ICD-10-CM | POA: Diagnosis not present

## 2019-01-11 DIAGNOSIS — I4819 Other persistent atrial fibrillation: Secondary | ICD-10-CM | POA: Diagnosis not present

## 2019-01-11 DIAGNOSIS — M4800 Spinal stenosis, site unspecified: Secondary | ICD-10-CM | POA: Diagnosis not present

## 2019-01-11 DIAGNOSIS — I4821 Permanent atrial fibrillation: Secondary | ICD-10-CM

## 2019-01-11 DIAGNOSIS — I10 Essential (primary) hypertension: Secondary | ICD-10-CM | POA: Diagnosis not present

## 2019-01-11 DIAGNOSIS — I6529 Occlusion and stenosis of unspecified carotid artery: Secondary | ICD-10-CM | POA: Insufficient documentation

## 2019-01-11 NOTE — Progress Notes (Signed)
Primary Care Physician: Harlan Stains Referring Physician: Harlan Stains, MD   Edgar Wilkerson is a 85 y.o. male with a history of asymptomatic persistent atrial fibrillation who presents for f/u in the [facility].  The patient was initially diagnosed with atrial fibrillation in 2014 and has been on anticoagulation with Eliquis since. He and his wife moved here from [facility] early in the year. He originally saw cardiology at [facility] but did not feel they had a good relationship and was referred to the AF clinic for management.  [place] 4-5 miles per day. He hopes to be able to play more golf when the weather improves.He has very little awareness of his atrial fibrillation. Continues on eliquis. Some bruising but no bleeding issues.   F/u in afib clinic 01/11/19. He reports that he is dong very well. Tolerating afib well. Very rarely will feel a flutter. Still very active riding his bike. Some bruising of his arms noted but no bleeding on the eliquis. He is very well rate controlled.  Today, he denies symptoms of palpitations, chest pain, shortness of breath, orthopnea, PND, lower extremity edema, dizziness, presyncope, syncope, snoring, daytime somnolence, bleeding, or neurologic sequela. The patient is tolerating medications without difficulties and is otherwise without complaint today.    Atrial Fibrillation Risk Factors:  he does not have symptoms or diagnosis of sleep apnea.  he does not have a history of rheumatic fever.  he does not have a history of alcohol use.  he has a BMI of Body mass index is 25.38 kg/m.Marland Kitchen Filed Weights   01/11/19 1104  Weight: 82.6 kg     Atrial Fibrillation Management history:  Previous antiarrhythmic drugs: none  Previous cardioversions: none  Previous ablations: none  CHADS2VASC score: 4  Anticoagulation history: Eliquis   Past Medical History:   Diagnosis Date  . BPH (benign prostatic hyperplasia)   . Carotid artery stenosis    a. s/p R CEA  . Carpal tunnel syndrome   . Hypertension   . Persistent atrial fibrillation   . Spinal stenosis    Past Surgical History:  Procedure Laterality Date  . BACK SURGERY Bilateral    x 3   . CAROTID ARTERY - SUBCLAVIAN ARTERY BYPASS GRAFT    . CARPAL TUNNEL RELEASE    . CATARACT EXTRACTION    . ROTATOR CUFF REPAIR      Current Outpatient Medications  Medication Sig Dispense Refill  . apixaban (ELIQUIS) 5 MG TABS tablet Take 1 tablet by mouth 2 (two) times daily.    . carvedilol (COREG) 12.5 MG tablet Take 1 tablet by mouth 2 (two) times daily.    Marland Kitchen lisinopril (PRINIVIL,ZESTRIL) 10 MG tablet Take 1 tablet by mouth daily.    . Multiple Vitamins-Minerals (AIRBORNE PO) Take by mouth.    . simvastatin (ZOCOR) 20 MG tablet Take 1 tablet by mouth daily.    . tamsulosin (FLOMAX) 0.4 MG CAPS capsule Take 0.4 mg by mouth daily.     No current facility-administered medications for this encounter.     No Known Allergies  Social History   Socioeconomic History  . Marital status: Unknown    Spouse name: Not on file  . Number of children: Not on file  . Years of education: Not on file  . Highest education level: Not on file  Occupational History  . Not on file  Social Needs  .  Financial resource strain: Not on file  . Food insecurity    Worry: Not on file    Inability: Not on file  . Transportation needs    Medical: Not on file    Non-medical: Not on file  Tobacco Use  . Smoking status: Former Research scientist (life sciences)  . Smokeless tobacco: Never Used  Substance and Sexual Activity  . Alcohol use: Yes    Alcohol/week: 5.0 standard drinks    Types: 5 Glasses of wine per week  . Drug use: Not on file  . Sexual activity: Not on file  Lifestyle  . Physical activity    Days per week: Not on file    Minutes per session: Not on file  . Stress: Not on file  Relationships  . Social Product manager on phone: Not on file    Gets together: Not on file    Attends religious service: Not on file    Active member of club or organization: Not on file    Attends meetings of clubs or organizations: Not on file    Relationship status: Not on file  . Intimate partner violence    Fear of current or ex partner: Not on file    Emotionally abused: Not on file    Physically abused: Not on file    Forced sexual activity: Not on file  Other Topics Concern  . Not on file  Social History Narrative  . Not on file    No family history on file. The patient does not have a history of early familial atrial fibrillation or other arrhythmias.  ROS- All systems are reviewed and negative except as per the HPI above.  Physical Exam: Vitals:   01/11/19 1104  BP: 138/72  Pulse: 63  Weight: 82.6 kg  Height: 5\' 11"  (1.803 m)    GEN- The patient is well appearing, alert and oriented x 3 today.   Head- normocephalic, atraumatic Eyes-  Sclera clear, conjunctiva pink Ears- hearing intact Oropharynx- clear Neck- supple  Lungs- Clear to ausculation bilaterally, normal work of breathing Heart- irregular rate and rhythm, no murmurs, rubs or gallops  GI- soft, NT, ND, + BS Extremities- no clubbing, cyanosis, or edema MS- no significant deformity or atrophy Skin- no rash or lesion Psych- euthymic mood, full affect Neuro- strength and sensation are intact  Wt Readings from Last 3 Encounters:  01/11/19 82.6 kg  07/13/18 81.2 kg  04/07/18 78.9 kg    EKG today demonstrates atrial fibrillation, rate 63, normal intervals  Epic records are reviewed at length today  Echo- Study Conclusions 04/19/18  - Left ventricle: The cavity size was normal. There was mild   concentric hypertrophy. Systolic function was normal. The   estimated ejection fraction was in the range of 60% to 65%. Wall   motion was normal; there were no regional wall motion   abnormalities. - Aortic valve: Transvalvular  velocity was within the normal range.   There was no stenosis. There was no regurgitation. - Mitral valve: Transvalvular velocity was within the normal range.   There was no evidence for stenosis. There was mild regurgitation.   Valve area by continuity equation (using LVOT flow): 2.26 cm^2. - Left atrium: The atrium was moderately dilated. - Right ventricle: The cavity size was normal. Wall thickness was   normal. Systolic function was normal. - Right atrium: The atrium was mildly dilated. - Tricuspid valve: There was trivial regurgitation. - Pulmonary arteries: Systolic pressure was within the normal  range. PA peak pressure: 27 mm Hg (S). - Pericardium, extracardiac: A trivial pericardial effusion was   identified. - Global longitudinal strain -18.1% (normal).  Assessment and Plan:  1. Persistent atrial fibrillation The patient has asymptomatic persistent atrial fibrillation.  He is appropriately anticoagulated with Eliquis at the correct dose.   As he is asymptomatic and has likely been in AF for 5 years, rate control is his long term approach   2.  HTN Stable No change required today  Follow up with AF clinic in 9 months   [place]. Dacie Mandel, [facility] [address] [place], [place] [address] [phone]

## 2019-01-19 DIAGNOSIS — C44519 Basal cell carcinoma of skin of other part of trunk: Secondary | ICD-10-CM | POA: Diagnosis not present

## 2019-01-19 DIAGNOSIS — L570 Actinic keratosis: Secondary | ICD-10-CM | POA: Diagnosis not present

## 2019-02-01 DIAGNOSIS — M12811 Other specific arthropathies, not elsewhere classified, right shoulder: Secondary | ICD-10-CM | POA: Diagnosis not present

## 2019-03-01 DIAGNOSIS — M47816 Spondylosis without myelopathy or radiculopathy, lumbar region: Secondary | ICD-10-CM | POA: Diagnosis not present

## 2019-03-01 DIAGNOSIS — M47817 Spondylosis without myelopathy or radiculopathy, lumbosacral region: Secondary | ICD-10-CM | POA: Diagnosis not present

## 2019-03-01 DIAGNOSIS — M5137 Other intervertebral disc degeneration, lumbosacral region: Secondary | ICD-10-CM | POA: Diagnosis not present

## 2019-03-01 DIAGNOSIS — M5136 Other intervertebral disc degeneration, lumbar region: Secondary | ICD-10-CM | POA: Diagnosis not present

## 2019-03-01 DIAGNOSIS — M5186 Other intervertebral disc disorders, lumbar region: Secondary | ICD-10-CM | POA: Diagnosis not present

## 2019-03-01 DIAGNOSIS — Z9889 Other specified postprocedural states: Secondary | ICD-10-CM | POA: Diagnosis not present

## 2019-03-03 DIAGNOSIS — C4442 Squamous cell carcinoma of skin of scalp and neck: Secondary | ICD-10-CM | POA: Diagnosis not present

## 2019-03-18 DIAGNOSIS — Z23 Encounter for immunization: Secondary | ICD-10-CM | POA: Diagnosis not present

## 2019-04-04 DIAGNOSIS — G5681 Other specified mononeuropathies of right upper limb: Secondary | ICD-10-CM | POA: Diagnosis not present

## 2019-04-04 DIAGNOSIS — M961 Postlaminectomy syndrome, not elsewhere classified: Secondary | ICD-10-CM | POA: Diagnosis not present

## 2019-04-04 DIAGNOSIS — M549 Dorsalgia, unspecified: Secondary | ICD-10-CM | POA: Diagnosis not present

## 2019-04-04 DIAGNOSIS — M47816 Spondylosis without myelopathy or radiculopathy, lumbar region: Secondary | ICD-10-CM | POA: Diagnosis not present

## 2019-04-18 DIAGNOSIS — Z85828 Personal history of other malignant neoplasm of skin: Secondary | ICD-10-CM | POA: Diagnosis not present

## 2019-04-18 DIAGNOSIS — C44712 Basal cell carcinoma of skin of right lower limb, including hip: Secondary | ICD-10-CM | POA: Diagnosis not present

## 2019-04-18 DIAGNOSIS — L821 Other seborrheic keratosis: Secondary | ICD-10-CM | POA: Diagnosis not present

## 2019-04-18 DIAGNOSIS — L905 Scar conditions and fibrosis of skin: Secondary | ICD-10-CM | POA: Diagnosis not present

## 2019-04-18 DIAGNOSIS — D485 Neoplasm of uncertain behavior of skin: Secondary | ICD-10-CM | POA: Diagnosis not present

## 2019-04-18 DIAGNOSIS — C44319 Basal cell carcinoma of skin of other parts of face: Secondary | ICD-10-CM | POA: Diagnosis not present

## 2019-04-18 DIAGNOSIS — C4442 Squamous cell carcinoma of skin of scalp and neck: Secondary | ICD-10-CM | POA: Diagnosis not present

## 2019-04-18 DIAGNOSIS — L570 Actinic keratosis: Secondary | ICD-10-CM | POA: Diagnosis not present

## 2019-04-22 DIAGNOSIS — M47816 Spondylosis without myelopathy or radiculopathy, lumbar region: Secondary | ICD-10-CM | POA: Diagnosis not present

## 2019-05-13 DIAGNOSIS — I872 Venous insufficiency (chronic) (peripheral): Secondary | ICD-10-CM | POA: Diagnosis not present

## 2019-05-13 DIAGNOSIS — I8393 Asymptomatic varicose veins of bilateral lower extremities: Secondary | ICD-10-CM | POA: Diagnosis not present

## 2019-05-16 DIAGNOSIS — C44622 Squamous cell carcinoma of skin of right upper limb, including shoulder: Secondary | ICD-10-CM | POA: Diagnosis not present

## 2019-07-13 ENCOUNTER — Ambulatory Visit: Payer: Medicare Other | Attending: Internal Medicine

## 2019-07-13 DIAGNOSIS — Z23 Encounter for immunization: Secondary | ICD-10-CM | POA: Insufficient documentation

## 2019-07-13 NOTE — Progress Notes (Signed)
   Covid-19 Vaccination Clinic  Name:  Edgar Wilkerson    MRN: [id number] DOB: 11/15/1929  07/13/2019  Mr. Edgar Wilkerson was observed post Covid-19 immunization for 30 minutes based on pre-vaccination screening without incidence. He was provided with Vaccine Information Sheet and instruction to access the V-Safe system.   Mr. Edgar Wilkerson was instructed to call 911 with any severe reactions post vaccine: Marland Kitchen Difficulty breathing  . Swelling of your face and throat  . A fast heartbeat  . A bad rash all over your body  . Dizziness and weakness    Immunizations Administered    Name Date Dose VIS Date Route   Pfizer COVID-19 Vaccine 07/13/2019  9:54 AM 0.3 mL 06/10/2019 Intramuscular   Manufacturer: [place]   Lot: [id number]   [place]: [id number]

## 2019-08-03 ENCOUNTER — Ambulatory Visit: Payer: Medicare Other | Attending: Internal Medicine

## 2019-08-03 DIAGNOSIS — Z23 Encounter for immunization: Secondary | ICD-10-CM | POA: Insufficient documentation

## 2019-08-03 NOTE — Progress Notes (Signed)
   Covid-19 Vaccination Clinic  Name:  Edgar Wilkerson    MRN: [id number] DOB: 1930-01-15  08/03/2019  Edgar Wilkerson was observed post Covid-19 immunization for 15 minutes without incidence. He was provided with Vaccine Information Sheet and instruction to access the V-Safe system.   Edgar Wilkerson was instructed to call 911 with any severe reactions post vaccine: Marland Kitchen Difficulty breathing  . Swelling of your face and throat  . A fast heartbeat  . A bad rash all over your body  . Dizziness and weakness    Immunizations Administered    Name Date Dose VIS Date Route   Pfizer COVID-19 Vaccine 08/03/2019  8:34 AM 0.3 mL 06/10/2019 Intramuscular   Manufacturer: [place]   Lot: [id number]   [place]: [id number]

## 2019-08-17 DIAGNOSIS — C44712 Basal cell carcinoma of skin of right lower limb, including hip: Secondary | ICD-10-CM | POA: Diagnosis not present

## 2019-08-17 DIAGNOSIS — C4442 Squamous cell carcinoma of skin of scalp and neck: Secondary | ICD-10-CM | POA: Diagnosis not present

## 2019-08-17 DIAGNOSIS — C44311 Basal cell carcinoma of skin of nose: Secondary | ICD-10-CM | POA: Diagnosis not present

## 2019-09-14 DIAGNOSIS — R270 Ataxia, unspecified: Secondary | ICD-10-CM | POA: Diagnosis not present

## 2019-09-14 DIAGNOSIS — Z6825 Body mass index (BMI) 25.0-25.9, adult: Secondary | ICD-10-CM | POA: Diagnosis not present

## 2019-09-14 DIAGNOSIS — Z79899 Other long term (current) drug therapy: Secondary | ICD-10-CM | POA: Diagnosis not present

## 2019-09-14 DIAGNOSIS — M791 Myalgia, unspecified site: Secondary | ICD-10-CM | POA: Diagnosis not present

## 2019-09-14 DIAGNOSIS — E782 Mixed hyperlipidemia: Secondary | ICD-10-CM | POA: Diagnosis not present

## 2019-09-14 DIAGNOSIS — M5416 Radiculopathy, lumbar region: Secondary | ICD-10-CM | POA: Diagnosis not present

## 2019-09-19 DIAGNOSIS — Z6825 Body mass index (BMI) 25.0-25.9, adult: Secondary | ICD-10-CM | POA: Diagnosis not present

## 2019-09-19 DIAGNOSIS — N400 Enlarged prostate without lower urinary tract symptoms: Secondary | ICD-10-CM | POA: Diagnosis not present

## 2019-09-19 DIAGNOSIS — E782 Mixed hyperlipidemia: Secondary | ICD-10-CM | POA: Diagnosis not present

## 2019-09-19 DIAGNOSIS — Z1331 Encounter for screening for depression: Secondary | ICD-10-CM | POA: Diagnosis not present

## 2019-09-19 DIAGNOSIS — Z0000 Encounter for general adult medical examination without abnormal findings: Secondary | ICD-10-CM | POA: Diagnosis not present

## 2019-09-19 DIAGNOSIS — N1831 Chronic kidney disease, stage 3a: Secondary | ICD-10-CM | POA: Diagnosis not present

## 2019-09-19 DIAGNOSIS — I480 Paroxysmal atrial fibrillation: Secondary | ICD-10-CM | POA: Diagnosis not present

## 2019-09-19 DIAGNOSIS — I10 Essential (primary) hypertension: Secondary | ICD-10-CM | POA: Diagnosis not present

## 2019-09-28 DIAGNOSIS — M5386 Other specified dorsopathies, lumbar region: Secondary | ICD-10-CM | POA: Diagnosis not present

## 2019-09-28 DIAGNOSIS — M6281 Muscle weakness (generalized): Secondary | ICD-10-CM | POA: Diagnosis not present

## 2019-09-28 DIAGNOSIS — R270 Ataxia, unspecified: Secondary | ICD-10-CM | POA: Diagnosis not present

## 2019-09-28 DIAGNOSIS — R29898 Other symptoms and signs involving the musculoskeletal system: Secondary | ICD-10-CM | POA: Diagnosis not present

## 2019-09-28 DIAGNOSIS — Z7409 Other reduced mobility: Secondary | ICD-10-CM | POA: Diagnosis not present

## 2019-10-12 ENCOUNTER — Other Ambulatory Visit: Payer: Self-pay

## 2019-10-12 ENCOUNTER — Encounter (HOSPITAL_COMMUNITY): Payer: Self-pay | Admitting: Nurse Practitioner

## 2019-10-12 ENCOUNTER — Ambulatory Visit (HOSPITAL_COMMUNITY)
Admission: RE | Admit: 2019-10-12 | Discharge: 2019-10-12 | Disposition: A | Discharge status: Home / Self Care | Payer: Medicare Other | Source: Ambulatory Visit | Attending: Nurse Practitioner | Admitting: Nurse Practitioner

## 2019-10-12 VITALS — BP 180/86 | HR 64 | Ht 71.0 in | Wt 179.4 lb

## 2019-10-12 DIAGNOSIS — Z87891 Personal history of nicotine dependence: Secondary | ICD-10-CM | POA: Insufficient documentation

## 2019-10-12 DIAGNOSIS — N400 Enlarged prostate without lower urinary tract symptoms: Secondary | ICD-10-CM | POA: Diagnosis not present

## 2019-10-12 DIAGNOSIS — I4821 Permanent atrial fibrillation: Secondary | ICD-10-CM | POA: Diagnosis not present

## 2019-10-12 DIAGNOSIS — I10 Essential (primary) hypertension: Secondary | ICD-10-CM | POA: Diagnosis not present

## 2019-10-12 DIAGNOSIS — Z79899 Other long term (current) drug therapy: Secondary | ICD-10-CM | POA: Diagnosis not present

## 2019-10-12 DIAGNOSIS — I6529 Occlusion and stenosis of unspecified carotid artery: Secondary | ICD-10-CM | POA: Insufficient documentation

## 2019-10-12 DIAGNOSIS — Z7901 Long term (current) use of anticoagulants: Secondary | ICD-10-CM | POA: Insufficient documentation

## 2019-10-12 DIAGNOSIS — D6869 Other thrombophilia: Secondary | ICD-10-CM | POA: Diagnosis not present

## 2019-10-12 DIAGNOSIS — I4819 Other persistent atrial fibrillation: Secondary | ICD-10-CM | POA: Insufficient documentation

## 2019-10-12 NOTE — Progress Notes (Signed)
Primary Care Physician: Harlan Stains Referring Physician: Harlan Stains, MD   Edgar Wilkerson is a 86 y.o. male with a history of asymptomatic persistent atrial fibrillation who presents for f/u in the [facility].  The patient was initially diagnosed with atrial fibrillation in 2014 and has been on anticoagulation with Eliquis since. He and his wife live in [place] as well has have a residence at [facility]. He originally saw cardiology at [facility] but did not feel they had a good relationship and was referred to the AF clinic for management.  He reports that when he is living at [employer], he rides his bike 4-5 miles per day. He plays   golf when the weather is nice.  .He has very little awareness of his atrial fibrillation. Continues on eliquis. Some bruising but no bleeding issues.   F/u in afib clinic 01/11/19. He reports that he is dong very well. Tolerating afib well. Very rarely will feel a flutter. Still very active riding his bike. Some bruising of his arms noted but no bleeding on the eliquis. He is very well rate controlled.  F/u in afib clinic 10/12/19. He has permanent afib. He is rate controlled. He rarely feels his heartbeat. He had recent labs drawn in a clinic in [place]. We will call and obtain these. He continues to live at [facility] part of the year and here the other part of the year. BP elevated at 180/86 on presentation but 144/86 on recheck.  Today, he denies symptoms of palpitations, chest pain, shortness of breath, orthopnea, PND, lower extremity edema, dizziness, presyncope, syncope, snoring, daytime somnolence, bleeding, or neurologic sequela. The patient is tolerating medications without difficulties and is otherwise without complaint today.    Atrial Fibrillation Risk Factors:  he does not have symptoms or diagnosis of sleep apnea.  he does not have a history of rheumatic fever.  he does not have a history of alcohol use.  he  has a BMI of Body mass index is 25.02 kg/m.Marland Kitchen Filed Weights   10/12/19 1104  Weight: 81.4 kg     Atrial Fibrillation Management history:  Previous antiarrhythmic drugs: none  Previous cardioversions: none  Previous ablations: none  CHADS2VASC score: 4  Anticoagulation history: Eliquis   Past Medical History:  Diagnosis Date  . BPH (benign prostatic hyperplasia)   . Carotid artery stenosis    a. s/p R CEA  . Carpal tunnel syndrome   . Hypertension   . Persistent atrial fibrillation   . Spinal stenosis    Past Surgical History:  Procedure Laterality Date  . BACK SURGERY Bilateral    x 3   . CAROTID ARTERY - SUBCLAVIAN ARTERY BYPASS GRAFT    . CARPAL TUNNEL RELEASE    . CATARACT EXTRACTION    . ROTATOR CUFF REPAIR      Current Outpatient Medications  Medication Sig Dispense Refill  . apixaban (ELIQUIS) 5 MG TABS tablet Take 1 tablet by mouth 2 (two) times daily.    . carvedilol (COREG) 12.5 MG tablet Take 1 tablet by mouth 2 (two) times daily.    . cephALEXin (KEFLEX) 500 MG capsule Take 500 mg by mouth 3 (three) times daily. As needed    . fluorouracil (EFUDEX) 5 % cream     . lisinopril (PRINIVIL,ZESTRIL) 10 MG tablet Take 1 tablet by mouth daily.    . Multiple Vitamins-Minerals (AIRBORNE PO) Take by mouth.    . simvastatin (ZOCOR) 20 MG tablet  Take 1 tablet by mouth daily.    . tamsulosin (FLOMAX) 0.4 MG CAPS capsule Take 0.4 mg by mouth daily.     No current facility-administered medications for this encounter.    No Known Allergies  Social History   Socioeconomic History  . Marital status: Unknown    Spouse name: Not on file  . Number of children: Not on file  . Years of education: Not on file  . Highest education level: Not on file  Occupational History  . Not on file  Tobacco Use  . Smoking status: Former Research scientist (life sciences)  . Smokeless tobacco: Never Used  Substance and Sexual Activity  . Alcohol use: Yes    Alcohol/week: 5.0 standard drinks    Types:  5 Glasses of wine per week  . Drug use: Not on file  . Sexual activity: Not on file  Other Topics Concern  . Not on file  Social History Narrative  . Not on file   Social Determinants of Health   Financial Resource Strain:   . Difficulty of Paying Living Expenses:   Food Insecurity:   . Worried About Charity fundraiser in the Last Year:   . Arboriculturist in the Last Year:   Transportation Needs:   . Film/video editor (Medical):   Marland Kitchen Lack of Transportation (Non-Medical):   Physical Activity:   . Days of Exercise per Week:   . Minutes of Exercise per Session:   Stress:   . Feeling of Stress :   Social Connections:   . Frequency of Communication with Friends and Family:   . Frequency of Social Gatherings with Friends and Family:   . Attends Religious Services:   . Active Member of Clubs or Organizations:   . Attends Archivist Meetings:   Marland Kitchen Marital Status:   Intimate Partner Violence:   . Fear of Current or Ex-Partner:   . Emotionally Abused:   Marland Kitchen Physically Abused:   . Sexually Abused:     No family history on file. The patient does not have a history of early familial atrial fibrillation or other arrhythmias.  ROS- All systems are reviewed and negative except as per the HPI above.  Physical Exam: Vitals:   10/12/19 1104  BP: (!) 180/86  Pulse: 64  Weight: 81.4 kg  Height: 5\' 11"  (1.803 m)    GEN- The patient is well appearing, alert and oriented x 3 today.   Head- normocephalic, atraumatic Eyes-  Sclera clear, conjunctiva pink Ears- hearing intact Oropharynx- clear Neck- supple  Lungs- Clear to ausculation bilaterally, normal work of breathing Heart- irregular rate and rhythm, no murmurs, rubs or gallops  GI- soft, NT, ND, + BS Extremities- no clubbing, cyanosis, or edema MS- no significant deformity or atrophy Skin- no rash or lesion Psych- euthymic mood, full affect Neuro- strength and sensation are intact  Wt Readings from Last 3  Encounters:  10/12/19 81.4 kg  01/11/19 82.6 kg  07/13/18 81.2 kg    EKG today demonstrates atrial fibrillation, rate 64, normal intervals  Epic records are reviewed at length today  Echo- Study Conclusions 04/19/18  - Left ventricle: The cavity size was normal. There was mild   concentric hypertrophy. Systolic function was normal. The   estimated ejection fraction was in the range of 60% to 65%. Wall   motion was normal; there were no regional wall motion   abnormalities. - Aortic valve: Transvalvular velocity was within the normal range.   There  was no stenosis. There was no regurgitation. - Mitral valve: Transvalvular velocity was within the normal range.   There was no evidence for stenosis. There was mild regurgitation.   Valve area by continuity equation (using LVOT flow): 2.26 cm^2. - Left atrium: The atrium was moderately dilated. - Right ventricle: The cavity size was normal. Wall thickness was   normal. Systolic function was normal. - Right atrium: The atrium was mildly dilated. - Tricuspid valve: There was trivial regurgitation. - Pulmonary arteries: Systolic pressure was within the normal   range. PA peak pressure: 27 mm Hg (S). - Pericardium, extracardiac: A trivial pericardial effusion was   identified. - Global longitudinal strain -18.1% (normal).  Assessment and Plan:  1. Persistent atrial fibrillation The patient has asymptomatic permanent  atrial fibrillation.  He is appropriately anticoagulated with Eliquis at the correct dose.  Will obtain recent labs to confirm creatinine. As he is asymptomatic and has likely been in AF for 5+ years, rate control is his long term approach   2.  HTN Stable No change required today  Follow up with AF clinic in 6  months   [place]. Edgar Wilkerson, [facility] [address] [place], [place] [address] [phone]

## 2019-10-20 DIAGNOSIS — Z6825 Body mass index (BMI) 25.0-25.9, adult: Secondary | ICD-10-CM | POA: Diagnosis not present

## 2019-10-20 DIAGNOSIS — M5416 Radiculopathy, lumbar region: Secondary | ICD-10-CM | POA: Diagnosis not present

## 2019-11-01 DIAGNOSIS — C4442 Squamous cell carcinoma of skin of scalp and neck: Secondary | ICD-10-CM | POA: Diagnosis not present

## 2019-11-09 DIAGNOSIS — M25511 Pain in right shoulder: Secondary | ICD-10-CM | POA: Diagnosis not present

## 2019-11-09 DIAGNOSIS — M12811 Other specific arthropathies, not elsewhere classified, right shoulder: Secondary | ICD-10-CM | POA: Diagnosis not present

## 2019-11-09 DIAGNOSIS — G8929 Other chronic pain: Secondary | ICD-10-CM | POA: Diagnosis not present

## 2019-12-06 DIAGNOSIS — G8929 Other chronic pain: Secondary | ICD-10-CM | POA: Diagnosis not present

## 2019-12-06 DIAGNOSIS — M25511 Pain in right shoulder: Secondary | ICD-10-CM | POA: Diagnosis not present

## 2020-01-24 DIAGNOSIS — L821 Other seborrheic keratosis: Secondary | ICD-10-CM | POA: Diagnosis not present

## 2020-01-24 DIAGNOSIS — L578 Other skin changes due to chronic exposure to nonionizing radiation: Secondary | ICD-10-CM | POA: Diagnosis not present

## 2020-01-24 DIAGNOSIS — D225 Melanocytic nevi of trunk: Secondary | ICD-10-CM | POA: Diagnosis not present

## 2020-01-24 DIAGNOSIS — L570 Actinic keratosis: Secondary | ICD-10-CM | POA: Diagnosis not present

## 2020-01-24 DIAGNOSIS — D1801 Hemangioma of skin and subcutaneous tissue: Secondary | ICD-10-CM | POA: Diagnosis not present

## 2020-01-24 DIAGNOSIS — L820 Inflamed seborrheic keratosis: Secondary | ICD-10-CM | POA: Diagnosis not present

## 2020-03-16 DIAGNOSIS — E782 Mixed hyperlipidemia: Secondary | ICD-10-CM | POA: Diagnosis not present

## 2020-03-20 DIAGNOSIS — Z6825 Body mass index (BMI) 25.0-25.9, adult: Secondary | ICD-10-CM | POA: Diagnosis not present

## 2020-03-20 DIAGNOSIS — I272 Pulmonary hypertension, unspecified: Secondary | ICD-10-CM | POA: Diagnosis not present

## 2020-03-20 DIAGNOSIS — F411 Generalized anxiety disorder: Secondary | ICD-10-CM | POA: Diagnosis not present

## 2020-03-20 DIAGNOSIS — E782 Mixed hyperlipidemia: Secondary | ICD-10-CM | POA: Diagnosis not present

## 2020-03-20 DIAGNOSIS — Z79899 Other long term (current) drug therapy: Secondary | ICD-10-CM | POA: Diagnosis not present

## 2020-03-20 DIAGNOSIS — I10 Essential (primary) hypertension: Secondary | ICD-10-CM | POA: Diagnosis not present

## 2020-03-20 DIAGNOSIS — I4891 Unspecified atrial fibrillation: Secondary | ICD-10-CM | POA: Diagnosis not present

## 2020-03-20 DIAGNOSIS — N1831 Chronic kidney disease, stage 3a: Secondary | ICD-10-CM | POA: Diagnosis not present

## 2020-03-21 DIAGNOSIS — Z23 Encounter for immunization: Secondary | ICD-10-CM | POA: Diagnosis not present

## 2020-03-27 DIAGNOSIS — Z23 Encounter for immunization: Secondary | ICD-10-CM | POA: Diagnosis not present

## 2020-04-04 DIAGNOSIS — C44712 Basal cell carcinoma of skin of right lower limb, including hip: Secondary | ICD-10-CM | POA: Diagnosis not present

## 2020-04-04 DIAGNOSIS — C44311 Basal cell carcinoma of skin of nose: Secondary | ICD-10-CM | POA: Diagnosis not present

## 2020-04-12 ENCOUNTER — Other Ambulatory Visit: Payer: Self-pay

## 2020-04-12 ENCOUNTER — Ambulatory Visit (HOSPITAL_COMMUNITY)
Admission: RE | Admit: 2020-04-12 | Discharge: 2020-04-12 | Disposition: A | Discharge status: Home / Self Care | Payer: Medicare Other | Source: Ambulatory Visit | Attending: Nurse Practitioner | Admitting: Nurse Practitioner

## 2020-04-12 ENCOUNTER — Encounter (HOSPITAL_COMMUNITY): Payer: Self-pay | Admitting: Nurse Practitioner

## 2020-04-12 VITALS — BP 180/78 | HR 59 | Ht 71.0 in | Wt 181.4 lb

## 2020-04-12 DIAGNOSIS — I10 Essential (primary) hypertension: Secondary | ICD-10-CM | POA: Insufficient documentation

## 2020-04-12 DIAGNOSIS — D6869 Other thrombophilia: Secondary | ICD-10-CM

## 2020-04-12 DIAGNOSIS — I4821 Permanent atrial fibrillation: Secondary | ICD-10-CM

## 2020-04-12 DIAGNOSIS — Z79899 Other long term (current) drug therapy: Secondary | ICD-10-CM | POA: Insufficient documentation

## 2020-04-12 DIAGNOSIS — I4819 Other persistent atrial fibrillation: Secondary | ICD-10-CM | POA: Diagnosis not present

## 2020-04-12 DIAGNOSIS — N400 Enlarged prostate without lower urinary tract symptoms: Secondary | ICD-10-CM | POA: Diagnosis not present

## 2020-04-12 DIAGNOSIS — Z951 Presence of aortocoronary bypass graft: Secondary | ICD-10-CM | POA: Insufficient documentation

## 2020-04-12 DIAGNOSIS — Z7901 Long term (current) use of anticoagulants: Secondary | ICD-10-CM | POA: Diagnosis not present

## 2020-04-12 DIAGNOSIS — I6529 Occlusion and stenosis of unspecified carotid artery: Secondary | ICD-10-CM | POA: Insufficient documentation

## 2020-04-12 DIAGNOSIS — Z87891 Personal history of nicotine dependence: Secondary | ICD-10-CM | POA: Diagnosis not present

## 2020-04-12 NOTE — Progress Notes (Signed)
Primary Care Physician: Harlan Stains Referring Physician: Harlan Stains, MD   Edgar Wilkerson is a 86 y.o. male with a history of asymptomatic persistent atrial fibrillation who presents for f/u in the [facility].  The patient was initially diagnosed with atrial fibrillation in 2014 and has been on anticoagulation with Eliquis since. He and his wife moved here from [facility] early in the year. He originally saw cardiology at [facility] but did not feel they had a good relationship and was referred to the AF clinic for management.  [place] 4-5 miles per day. He hopes to be able to play more golf when the weather improves.He has very little awareness of his atrial fibrillation. Continues on eliquis. Some bruising but no bleeding issues.   F/u in afib clinic 01/11/19. He reports that he is dong very well. Tolerating afib well. Very rarely will feel a flutter. Still very active riding his bike. Some bruising of his arms noted but no bleeding on the eliquis. He is very well rate controlled.  F/u in afib clinic, 04/12/20. He remains in rate controlled afib. He has no complaints today. Remains active. Labs reviewed from  his PCP from June. Creatinine at 1.2 so he will stay on 5 mg eliquis bid.    Today, he denies symptoms of palpitations, chest pain, shortness of breath, orthopnea, PND, lower extremity edema, dizziness, presyncope, syncope, snoring, daytime somnolence, bleeding, or neurologic sequela. The patient is tolerating medications without difficulties and is otherwise without complaint today.    Atrial Fibrillation Risk Factors:  he does not have symptoms or diagnosis of sleep apnea.  he does not have a history of rheumatic fever.  he does not have a history of alcohol use.  he has a BMI of Body mass index is 25.3 kg/m.Marland Kitchen Filed Weights   04/12/20 1104  Weight: 82.3 kg     Atrial  Fibrillation Management history:  Previous antiarrhythmic drugs: none  Previous cardioversions: none  Previous ablations: none  CHADS2VASC score: 4  Anticoagulation history: Eliquis   Past Medical History:  Diagnosis Date  . BPH (benign prostatic hyperplasia)   . Carotid artery stenosis    a. s/p R CEA  . Carpal tunnel syndrome   . Hypertension   . Persistent atrial fibrillation ([place])   . Spinal stenosis    Past Surgical History:  Procedure Laterality Date  . BACK SURGERY Bilateral    x 3   . CAROTID ARTERY - SUBCLAVIAN ARTERY BYPASS GRAFT    . CARPAL TUNNEL RELEASE    . CATARACT EXTRACTION    . ROTATOR CUFF REPAIR      Current Outpatient Medications  Medication Sig Dispense Refill  . apixaban (ELIQUIS) 5 MG TABS tablet Take 1 tablet by mouth 2 (two) times daily.    . carvedilol (COREG) 12.5 MG tablet Take 1 tablet by mouth 2 (two) times daily.    . colchicine 0.6 MG tablet Take 0.6 mg by mouth as needed.     . fluorouracil (EFUDEX) 5 % cream Apply topically as needed.     Marland Kitchen HYDROcodone-acetaminophen (NORCO/VICODIN) 5-325 MG tablet Take 1 tablet by mouth as needed.     . hydrOXYzine (ATARAX/VISTARIL) 25 MG tablet take 1 tablet by oral route 2 times every day AS NEEDED FOR ITCHING    . lisinopril (PRINIVIL,ZESTRIL) 10 MG tablet Take 1 tablet by mouth daily.    Marland Kitchen  Multiple Vitamins-Minerals (AIRBORNE PO) Take 1 tablet by mouth daily.     . simvastatin (ZOCOR) 20 MG tablet Take 1 tablet by mouth daily.    . tamsulosin (FLOMAX) 0.4 MG CAPS capsule Take 0.4 mg by mouth daily.    . pneumococcal 13-valent conjugate vaccine (PREVNAR 13) SUSP injection Prevnar 13 (PF) 0.5 mL intramuscular syringe (Patient not taking: Reported on 04/12/2020)     No current facility-administered medications for this encounter.    No Known Allergies  Social History   Socioeconomic History  . Marital status: Married    Spouse name: Not on file  . Number of children: Not on file  . Years of  education: Not on file  . Highest education level: Not on file  Occupational History  . Not on file  Tobacco Use  . Smoking status: Former Research scientist (life sciences)  . Smokeless tobacco: Never Used  Substance and Sexual Activity  . Alcohol use: Yes    Alcohol/week: 5.0 standard drinks    Types: 5 Glasses of wine per week  . Drug use: Not on file  . Sexual activity: Not on file  Other Topics Concern  . Not on file  Social History Narrative  . Not on file   Social Determinants of Health   Financial Resource Strain:   . Difficulty of Paying Living Expenses: Not on file  Food Insecurity:   . Worried About Charity fundraiser in the Last Year: Not on file  . Ran Out of Food in the Last Year: Not on file  Transportation Needs:   . Lack of Transportation (Medical): Not on file  . Lack of Transportation (Non-Medical): Not on file  Physical Activity:   . Days of Exercise per Week: Not on file  . Minutes of Exercise per Session: Not on file  Stress:   . Feeling of Stress : Not on file  Social Connections:   . Frequency of Communication with Friends and Family: Not on file  . Frequency of Social Gatherings with Friends and Family: Not on file  . Attends Religious Services: Not on file  . Active Member of Clubs or Organizations: Not on file  . Attends Archivist Meetings: Not on file  . Marital Status: Not on file  Intimate Partner Violence:   . Fear of Current or Ex-Partner: Not on file  . Emotionally Abused: Not on file  . Physically Abused: Not on file  . Sexually Abused: Not on file    No family history on file. The patient does not have a history of early familial atrial fibrillation or other arrhythmias.  ROS- All systems are reviewed and negative except as per the HPI above.  Physical Exam: Vitals:   04/12/20 1104  BP: (!) 180/78  Pulse: (!) 59  Weight: 82.3 kg  Height: 5\' 11"  (1.803 m)    GEN- The patient is well appearing, alert and oriented x 3 today.   Head-  normocephalic, atraumatic Eyes-  Sclera clear, conjunctiva pink Ears- hearing intact Oropharynx- clear Neck- supple  Lungs- Clear to ausculation bilaterally, normal work of breathing Heart- irregular rate and rhythm, no murmurs, rubs or gallops  GI- soft, NT, ND, + BS Extremities- no clubbing, cyanosis, or edema MS- no significant deformity or atrophy Skin- no rash or lesion Psych- euthymic mood, full affect Neuro- strength and sensation are intact  Wt Readings from Last 3 Encounters:  04/12/20 82.3 kg  10/12/19 81.4 kg  01/11/19 82.6 kg    EKG today  demonstrates atrial fibrillation with slow v rate 59 bpm, qrs int 92 ms, qtc 399 ms   Epic records are reviewed at length today  Echo- Study Conclusions 04/19/18  - Left ventricle: The cavity size was normal. There was mild   concentric hypertrophy. Systolic function was normal. The   estimated ejection fraction was in the range of 60% to 65%. Wall   motion was normal; there were no regional wall motion   abnormalities. - Aortic valve: Transvalvular velocity was within the normal range.   There was no stenosis. There was no regurgitation. - Mitral valve: Transvalvular velocity was within the normal range.   There was no evidence for stenosis. There was mild regurgitation.   Valve area by continuity equation (using LVOT flow): 2.26 cm^2. - Left atrium: The atrium was moderately dilated. - Right ventricle: The cavity size was normal. Wall thickness was   normal. Systolic function was normal. - Right atrium: The atrium was mildly dilated. - Tricuspid valve: There was trivial regurgitation. - Pulmonary arteries: Systolic pressure was within the normal   range. PA peak pressure: 27 mm Hg (S). - Pericardium, extracardiac: A trivial pericardial effusion was   identified. - Global longitudinal strain -18.1% (normal).  Assessment and Plan:  1. Persistent atrial fibrillation The patient has asymptomatic persistent atrial  fibrillation.  He is appropriately anticoagulated with Eliquis at the correct dose.   As he is asymptomatic and has likely been in AF for 5+ years, rate control is his long term approach   2.  HTN Elevated today States white coat syndrome  He will monitor at home when he is in his usual environment  No change   today  Follow up with AF clinic in 1 year   Butch Penny C. Audrey Eller, [facility] [address] [place], [place] [address] [phone]

## 2020-06-04 DIAGNOSIS — L821 Other seborrheic keratosis: Secondary | ICD-10-CM | POA: Diagnosis not present

## 2020-06-04 DIAGNOSIS — D485 Neoplasm of uncertain behavior of skin: Secondary | ICD-10-CM | POA: Diagnosis not present

## 2020-06-04 DIAGNOSIS — Z85828 Personal history of other malignant neoplasm of skin: Secondary | ICD-10-CM | POA: Diagnosis not present

## 2020-06-04 DIAGNOSIS — C44622 Squamous cell carcinoma of skin of right upper limb, including shoulder: Secondary | ICD-10-CM | POA: Diagnosis not present

## 2020-06-04 DIAGNOSIS — C44519 Basal cell carcinoma of skin of other part of trunk: Secondary | ICD-10-CM | POA: Diagnosis not present

## 2020-06-04 DIAGNOSIS — C44319 Basal cell carcinoma of skin of other parts of face: Secondary | ICD-10-CM | POA: Diagnosis not present

## 2020-06-04 DIAGNOSIS — L905 Scar conditions and fibrosis of skin: Secondary | ICD-10-CM | POA: Diagnosis not present

## 2020-06-04 DIAGNOSIS — L570 Actinic keratosis: Secondary | ICD-10-CM | POA: Diagnosis not present

## 2020-07-06 DIAGNOSIS — C44622 Squamous cell carcinoma of skin of right upper limb, including shoulder: Secondary | ICD-10-CM | POA: Diagnosis not present

## 2020-07-06 DIAGNOSIS — C44319 Basal cell carcinoma of skin of other parts of face: Secondary | ICD-10-CM | POA: Diagnosis not present

## 2020-07-06 DIAGNOSIS — C44519 Basal cell carcinoma of skin of other part of trunk: Secondary | ICD-10-CM | POA: Diagnosis not present

## 2020-07-27 DIAGNOSIS — C44619 Basal cell carcinoma of skin of left upper limb, including shoulder: Secondary | ICD-10-CM | POA: Diagnosis not present

## 2020-07-27 DIAGNOSIS — D485 Neoplasm of uncertain behavior of skin: Secondary | ICD-10-CM | POA: Diagnosis not present

## 2020-07-27 DIAGNOSIS — C44519 Basal cell carcinoma of skin of other part of trunk: Secondary | ICD-10-CM | POA: Diagnosis not present

## 2020-07-30 DIAGNOSIS — M25511 Pain in right shoulder: Secondary | ICD-10-CM | POA: Diagnosis not present

## 2020-07-30 DIAGNOSIS — S46011A Strain of muscle(s) and tendon(s) of the rotator cuff of right shoulder, initial encounter: Secondary | ICD-10-CM | POA: Diagnosis not present

## 2020-08-06 DIAGNOSIS — I10 Essential (primary) hypertension: Secondary | ICD-10-CM | POA: Diagnosis not present

## 2020-08-23 DIAGNOSIS — C44319 Basal cell carcinoma of skin of other parts of face: Secondary | ICD-10-CM | POA: Diagnosis not present

## 2020-09-12 DIAGNOSIS — Z79899 Other long term (current) drug therapy: Secondary | ICD-10-CM | POA: Diagnosis not present

## 2020-09-12 DIAGNOSIS — E782 Mixed hyperlipidemia: Secondary | ICD-10-CM | POA: Diagnosis not present

## 2020-09-19 DIAGNOSIS — I272 Pulmonary hypertension, unspecified: Secondary | ICD-10-CM | POA: Diagnosis not present

## 2020-09-19 DIAGNOSIS — Z23 Encounter for immunization: Secondary | ICD-10-CM | POA: Diagnosis not present

## 2020-09-19 DIAGNOSIS — Z0000 Encounter for general adult medical examination without abnormal findings: Secondary | ICD-10-CM | POA: Diagnosis not present

## 2020-09-19 DIAGNOSIS — N1831 Chronic kidney disease, stage 3a: Secondary | ICD-10-CM | POA: Diagnosis not present

## 2020-09-19 DIAGNOSIS — F411 Generalized anxiety disorder: Secondary | ICD-10-CM | POA: Diagnosis not present

## 2020-09-19 DIAGNOSIS — E782 Mixed hyperlipidemia: Secondary | ICD-10-CM | POA: Diagnosis not present

## 2020-09-19 DIAGNOSIS — Z6825 Body mass index (BMI) 25.0-25.9, adult: Secondary | ICD-10-CM | POA: Diagnosis not present

## 2020-09-19 DIAGNOSIS — I480 Paroxysmal atrial fibrillation: Secondary | ICD-10-CM | POA: Diagnosis not present

## 2020-09-19 DIAGNOSIS — I10 Essential (primary) hypertension: Secondary | ICD-10-CM | POA: Diagnosis not present

## 2020-11-01 DIAGNOSIS — T7840XA Allergy, unspecified, initial encounter: Secondary | ICD-10-CM | POA: Diagnosis not present

## 2020-11-06 DIAGNOSIS — D485 Neoplasm of uncertain behavior of skin: Secondary | ICD-10-CM | POA: Diagnosis not present

## 2020-11-06 DIAGNOSIS — C44219 Basal cell carcinoma of skin of left ear and external auricular canal: Secondary | ICD-10-CM | POA: Diagnosis not present

## 2020-11-06 DIAGNOSIS — C44712 Basal cell carcinoma of skin of right lower limb, including hip: Secondary | ICD-10-CM | POA: Diagnosis not present

## 2020-11-06 DIAGNOSIS — C44622 Squamous cell carcinoma of skin of right upper limb, including shoulder: Secondary | ICD-10-CM | POA: Diagnosis not present

## 2020-12-06 DIAGNOSIS — Z23 Encounter for immunization: Secondary | ICD-10-CM | POA: Diagnosis not present

## 2020-12-14 DIAGNOSIS — L905 Scar conditions and fibrosis of skin: Secondary | ICD-10-CM | POA: Diagnosis not present

## 2020-12-14 DIAGNOSIS — Z85828 Personal history of other malignant neoplasm of skin: Secondary | ICD-10-CM | POA: Diagnosis not present

## 2020-12-14 DIAGNOSIS — C44519 Basal cell carcinoma of skin of other part of trunk: Secondary | ICD-10-CM | POA: Diagnosis not present

## 2020-12-14 DIAGNOSIS — D485 Neoplasm of uncertain behavior of skin: Secondary | ICD-10-CM | POA: Diagnosis not present

## 2020-12-14 DIAGNOSIS — L820 Inflamed seborrheic keratosis: Secondary | ICD-10-CM | POA: Diagnosis not present

## 2020-12-27 DIAGNOSIS — L923 Foreign body granuloma of the skin and subcutaneous tissue: Secondary | ICD-10-CM | POA: Diagnosis not present

## 2020-12-27 DIAGNOSIS — L820 Inflamed seborrheic keratosis: Secondary | ICD-10-CM | POA: Diagnosis not present

## 2020-12-27 DIAGNOSIS — D485 Neoplasm of uncertain behavior of skin: Secondary | ICD-10-CM | POA: Diagnosis not present

## 2020-12-27 DIAGNOSIS — C44722 Squamous cell carcinoma of skin of right lower limb, including hip: Secondary | ICD-10-CM | POA: Diagnosis not present

## 2020-12-27 DIAGNOSIS — C44712 Basal cell carcinoma of skin of right lower limb, including hip: Secondary | ICD-10-CM | POA: Diagnosis not present

## 2021-01-10 DIAGNOSIS — M9963 Osseous and subluxation stenosis of intervertebral foramina of lumbar region: Secondary | ICD-10-CM | POA: Diagnosis not present

## 2021-01-10 DIAGNOSIS — M961 Postlaminectomy syndrome, not elsewhere classified: Secondary | ICD-10-CM | POA: Diagnosis not present

## 2021-01-10 DIAGNOSIS — M2578 Osteophyte, vertebrae: Secondary | ICD-10-CM | POA: Diagnosis not present

## 2021-01-10 DIAGNOSIS — M4316 Spondylolisthesis, lumbar region: Secondary | ICD-10-CM | POA: Diagnosis not present

## 2021-01-10 DIAGNOSIS — M5136 Other intervertebral disc degeneration, lumbar region: Secondary | ICD-10-CM | POA: Diagnosis not present

## 2021-01-10 DIAGNOSIS — M4606 Spinal enthesopathy, lumbar region: Secondary | ICD-10-CM | POA: Diagnosis not present

## 2021-01-10 DIAGNOSIS — M47816 Spondylosis without myelopathy or radiculopathy, lumbar region: Secondary | ICD-10-CM | POA: Diagnosis not present

## 2021-01-12 DIAGNOSIS — M47816 Spondylosis without myelopathy or radiculopathy, lumbar region: Secondary | ICD-10-CM | POA: Diagnosis not present

## 2021-01-12 DIAGNOSIS — M4316 Spondylolisthesis, lumbar region: Secondary | ICD-10-CM | POA: Diagnosis not present

## 2021-01-12 DIAGNOSIS — M5136 Other intervertebral disc degeneration, lumbar region: Secondary | ICD-10-CM | POA: Diagnosis not present

## 2021-01-15 DIAGNOSIS — M9963 Osseous and subluxation stenosis of intervertebral foramina of lumbar region: Secondary | ICD-10-CM | POA: Diagnosis not present

## 2021-01-15 DIAGNOSIS — M2578 Osteophyte, vertebrae: Secondary | ICD-10-CM | POA: Diagnosis not present

## 2021-01-15 DIAGNOSIS — M5136 Other intervertebral disc degeneration, lumbar region: Secondary | ICD-10-CM | POA: Diagnosis not present

## 2021-01-15 DIAGNOSIS — M4316 Spondylolisthesis, lumbar region: Secondary | ICD-10-CM | POA: Diagnosis not present

## 2021-01-15 DIAGNOSIS — M961 Postlaminectomy syndrome, not elsewhere classified: Secondary | ICD-10-CM | POA: Diagnosis not present

## 2021-01-18 DIAGNOSIS — M4316 Spondylolisthesis, lumbar region: Secondary | ICD-10-CM | POA: Diagnosis not present

## 2021-01-18 DIAGNOSIS — M5124 Other intervertebral disc displacement, thoracic region: Secondary | ICD-10-CM | POA: Diagnosis not present

## 2021-01-18 DIAGNOSIS — M961 Postlaminectomy syndrome, not elsewhere classified: Secondary | ICD-10-CM | POA: Diagnosis not present

## 2021-01-18 DIAGNOSIS — M47814 Spondylosis without myelopathy or radiculopathy, thoracic region: Secondary | ICD-10-CM | POA: Diagnosis not present

## 2021-01-18 DIAGNOSIS — M2578 Osteophyte, vertebrae: Secondary | ICD-10-CM | POA: Diagnosis not present

## 2021-01-18 DIAGNOSIS — M5136 Other intervertebral disc degeneration, lumbar region: Secondary | ICD-10-CM | POA: Diagnosis not present

## 2021-01-18 DIAGNOSIS — M47816 Spondylosis without myelopathy or radiculopathy, lumbar region: Secondary | ICD-10-CM | POA: Diagnosis not present

## 2021-01-18 DIAGNOSIS — M9963 Osseous and subluxation stenosis of intervertebral foramina of lumbar region: Secondary | ICD-10-CM | POA: Diagnosis not present

## 2021-01-18 DIAGNOSIS — M4606 Spinal enthesopathy, lumbar region: Secondary | ICD-10-CM | POA: Diagnosis not present

## 2021-01-21 ENCOUNTER — Emergency Department (HOSPITAL_COMMUNITY)
Admission: EM | Admit: 2021-01-21 | Discharge: 2021-01-21 | Discharge status: Against Medical Advice | Payer: Medicare Other

## 2021-01-21 ENCOUNTER — Encounter (HOSPITAL_BASED_OUTPATIENT_CLINIC_OR_DEPARTMENT_OTHER): Payer: Self-pay | Admitting: Urology

## 2021-01-21 ENCOUNTER — Emergency Department (HOSPITAL_BASED_OUTPATIENT_CLINIC_OR_DEPARTMENT_OTHER)
Admission: EM | Admit: 2021-01-21 | Discharge: 2021-01-22 | Disposition: A | Discharge status: Home / Self Care | Payer: Medicare Other | Attending: Emergency Medicine | Admitting: Emergency Medicine

## 2021-01-21 ENCOUNTER — Other Ambulatory Visit: Payer: Self-pay

## 2021-01-21 ENCOUNTER — Emergency Department (HOSPITAL_BASED_OUTPATIENT_CLINIC_OR_DEPARTMENT_OTHER): Payer: Medicare Other

## 2021-01-21 DIAGNOSIS — I10 Essential (primary) hypertension: Secondary | ICD-10-CM | POA: Diagnosis not present

## 2021-01-21 DIAGNOSIS — M7989 Other specified soft tissue disorders: Secondary | ICD-10-CM | POA: Diagnosis not present

## 2021-01-21 DIAGNOSIS — Z79899 Other long term (current) drug therapy: Secondary | ICD-10-CM | POA: Insufficient documentation

## 2021-01-21 DIAGNOSIS — I872 Venous insufficiency (chronic) (peripheral): Secondary | ICD-10-CM | POA: Diagnosis not present

## 2021-01-21 DIAGNOSIS — Z7901 Long term (current) use of anticoagulants: Secondary | ICD-10-CM | POA: Insufficient documentation

## 2021-01-21 DIAGNOSIS — Z951 Presence of aortocoronary bypass graft: Secondary | ICD-10-CM | POA: Diagnosis not present

## 2021-01-21 DIAGNOSIS — Z87891 Personal history of nicotine dependence: Secondary | ICD-10-CM | POA: Diagnosis not present

## 2021-01-21 DIAGNOSIS — L03115 Cellulitis of right lower limb: Secondary | ICD-10-CM | POA: Diagnosis not present

## 2021-01-21 DIAGNOSIS — R600 Localized edema: Secondary | ICD-10-CM

## 2021-01-21 MED ORDER — CEPHALEXIN 500 MG PO CAPS: 500.0000 mg | ORAL_CAPSULE | Freq: Four times a day (QID) | ORAL | 0 refills | Status: AC

## 2021-01-21 NOTE — ED Notes (Signed)
Pt called for triage no answer  °

## 2021-01-21 NOTE — ED Triage Notes (Signed)
Right leg swelling/edema +3 pitting x 4 days.  With Afib clinic.  Denies any pain

## 2021-01-21 NOTE — Discharge Instructions (Addendum)
There was no evidence of a blood clot on your ultrasound. I am concerned that you may have a skin infection to the lower leg so I have given you a prescription for an antibiotics to cover for that. Please take the antibiotics as directed   Please wear compression stockings to help with the swelling in your legs  Please follow up with your primary care provider within 5-7 days for re-evaluation of your symptoms. If you do not have a primary care provider, information for a healthcare clinic has been provided for you to make arrangements for follow up care. Please return to the emergency department for any new or worsening symptoms.

## 2021-01-21 NOTE — ED Provider Notes (Signed)
[name] Provider Note   CSN: [id number] Arrival date & time: 01/21/21  2011     History Chief Complaint  Patient presents with   Leg Swelling    Edgar Wilkerson is a 87 y.o. male.  HPI  Patient is an 87 year old male with a history of BPH, carotid artery stenosis, carpal tunnel syndrome, hypertension, persistent atrial fibrillation, spinal stenosis, who presents the emergency department today for evaluation of right lower extremity swelling and redness.  Patient states he was seen by his dermatologist earlier today who referred him to a different doctor.  He went to the [facility], ED and states there was too long of a wait so he left and went to urgent care and then was advised to come to the ED due to concern for DVT.  Patient is currently on Eliquis for A. fib, he denies any missed doses.  He does report recent long trip to the beach.  He denies any new chest pain or shortness of breath.  There have been no fevers or systemic symptoms.  Past Medical History:  Diagnosis Date   BPH (benign prostatic hyperplasia)    Carotid artery stenosis    a. s/p R CEA   Carpal tunnel syndrome    Hypertension    Persistent atrial fibrillation ([place])    Spinal stenosis     There are no problems to display for this patient.   Past Surgical History:  Procedure Laterality Date   BACK SURGERY Bilateral    x 3    CAROTID ARTERY - SUBCLAVIAN ARTERY BYPASS GRAFT     CARPAL TUNNEL RELEASE     CATARACT EXTRACTION     ROTATOR CUFF REPAIR         History reviewed. No pertinent family history.  Social History   Tobacco Use   Smoking status: Former   Smokeless tobacco: Never  Substance Use Topics   Alcohol use: Yes    Alcohol/week: 5.0 standard drinks    Types: 5 Glasses of wine per week    Home Medications Prior to Admission medications   Medication Sig Start Date End Date Taking? Authorizing Provider  cephALEXin (KEFLEX) 500 MG capsule Take 1 capsule  (500 mg total) by mouth 4 (four) times daily for 7 days. 01/21/21 01/28/21 Yes Sibel Khurana S, PA-C  apixaban (ELIQUIS) 5 MG TABS tablet Take 1 tablet by mouth 2 (two) times daily. 10/21/17   [provider]  carvedilol (COREG) 12.5 MG tablet Take 1 tablet by mouth 2 (two) times daily. 09/06/17   [provider]  colchicine 0.6 MG tablet Take 0.6 mg by mouth as needed.  11/17/19   [provider]  fluorouracil (EFUDEX) 5 % cream Apply topically as needed.  01/19/19   [provider]  HYDROcodone-acetaminophen (NORCO/VICODIN) 5-325 MG tablet Take 1 tablet by mouth as needed.     [provider]  hydrOXYzine (ATARAX/VISTARIL) 25 MG tablet take 1 tablet by oral route 2 times every day AS NEEDED FOR ITCHING 09/19/19   [provider]  lisinopril (PRINIVIL,ZESTRIL) 10 MG tablet Take 1 tablet by mouth daily. 12/13/17   [provider]  Multiple Vitamins-Minerals (AIRBORNE PO) Take 1 tablet by mouth daily.     [provider]  pneumococcal 13-valent conjugate vaccine (PREVNAR 13) SUSP injection Prevnar 13 (PF) 0.5 mL intramuscular syringe Patient not taking: Reported on 04/12/2020    [provider]  simvastatin (ZOCOR) 20 MG tablet Take 1 tablet by mouth daily.  12/13/17   [provider]  tamsulosin (FLOMAX) 0.4 MG CAPS capsule Take 0.4 mg by mouth daily.    [provider]    Allergies    Patient has no known allergies.  Review of Systems   Review of Systems  Constitutional:  Negative for fever.  Respiratory:  Negative for cough and shortness of breath.   Cardiovascular:  Positive for leg swelling. Negative for chest pain.  Skin:  Positive for color change.   Physical Exam Updated Vital Signs BP (!) 155/81 (BP Location: Left Arm)   Pulse 74   Temp 97.9 F (36.6 C) (Oral)   Resp 18   Ht '5\' 11"'$  (1.803 m)   Wt 81.6 kg   SpO2 99%   BMI 25.10 kg/m   Physical Exam Constitutional:      General: He  is not in acute distress.    Appearance: He is well-developed.  Eyes:     Conjunctiva/sclera: Conjunctivae normal.  Cardiovascular:     Rate and Rhythm: Normal rate and regular rhythm.  Pulmonary:     Effort: Pulmonary effort is normal.     Breath sounds: Normal breath sounds.  Musculoskeletal:     Comments: Ble edema (R>L). Mild erythema noted to the RLE with some warmth noted. He has multiple scabs noted on his LLE. DP pulse is 2+ and symmetric  Skin:    General: Skin is warm and dry.  Neurological:     Mental Status: He is alert and oriented to person, place, and time.    ED Results / Procedures / Treatments   Labs (all labs ordered are listed, but only abnormal results are displayed) Labs Reviewed - No data to display  EKG EKG Interpretation  Date/Time:  Monday January 21 2021 20:21:50 EDT Ventricular Rate:  78 PR Interval:    QRS Duration: 80 QT Interval:  364 QTC Calculation: 414 R Axis:   22 Text Interpretation: Atrial fibrillation with premature ventricular or aberrantly conducted complexes Low voltage QRS Abnormal ECG No significant change since last tracing Confirmed by Dorie Rank [phone]) on 01/21/2021 8:28:10 PM  Radiology US Venous Img Lower Unilateral Right  Result Date: 01/21/2021 CLINICAL DATA:  Right lower extremity swelling. EXAM: RIGHT LOWER EXTREMITY VENOUS DOPPLER ULTRASOUND TECHNIQUE: Gray-scale sonography with compression, as well as color and duplex ultrasound, were performed to evaluate the deep venous system(s) from the level of the common femoral vein through the popliteal and proximal calf veins. COMPARISON:  None. FINDINGS: VENOUS Normal compressibility of the RIGHT common femoral, superficial femoral, and popliteal veins, as well as the visualized RIGHT calf veins (the right peroneal vein is poorly visualized). Visualized portions of profunda femoral vein and great saphenous vein unremarkable. No filling defects to suggest DVT on grayscale or color  Doppler imaging. Doppler waveforms show normal direction of venous flow, normal respiratory plasticity and response to augmentation. Limited views of the contralateral common femoral vein are unremarkable. OTHER Diffuse RIGHT calf edema is noted. Limitations: none IMPRESSION: 1. No evidence of RIGHT lower extremity DVT. 2. Diffuse right calf edema. Electronically Signed   By: Virgina Norfolk M.D.   On: 01/21/2021 23:01    Procedures Procedures   Medications Ordered in ED Medications - No data to display  ED Course  I have reviewed the triage vital signs and the nursing notes.  Pertinent labs & imaging results that were available during my care of the patient were reviewed by me and considered in my medical decision making (see chart  for details).    MDM Rules/Calculators/A&P                          87 year old male presenting the emergency department today for evaluation of swelling and redness to the right lower extremity.  Urgent care had concern for DVT so he was sent here for further evaluation.  Right lower extremity ultrasound does not demonstrate DVT.  I suspect he may have an early cellulitis.  There is no crepitus or concern for necrotizing infection.  Doubt arterial involvement as he has good peripheral pulses.  We will start him on antibiotics and have him follow-up with his PCP.  Advised on strict return precautions for new or any worsening symptoms.  He voices understanding of the plan and reasons to return.  All questions answered.  Patient stable for discharge.   Final Clinical Impression(s) / ED Diagnoses Final diagnoses:  Edema of right lower extremity    Rx / DC Orders ED Discharge Orders          Ordered    cephALEXin (KEFLEX) 500 MG capsule  4 times daily        01/21/21 2330             Rodney Booze, PA-C 01/21/21 2331    Dorie Rank, MD 01/23/21 1418

## 2021-01-28 DIAGNOSIS — M47816 Spondylosis without myelopathy or radiculopathy, lumbar region: Secondary | ICD-10-CM | POA: Diagnosis not present

## 2021-01-28 DIAGNOSIS — M5136 Other intervertebral disc degeneration, lumbar region: Secondary | ICD-10-CM | POA: Diagnosis not present

## 2021-01-28 DIAGNOSIS — M2578 Osteophyte, vertebrae: Secondary | ICD-10-CM | POA: Diagnosis not present

## 2021-01-28 DIAGNOSIS — M899 Disorder of bone, unspecified: Secondary | ICD-10-CM | POA: Diagnosis not present

## 2021-01-31 DIAGNOSIS — Z6825 Body mass index (BMI) 25.0-25.9, adult: Secondary | ICD-10-CM | POA: Diagnosis not present

## 2021-01-31 DIAGNOSIS — L03115 Cellulitis of right lower limb: Secondary | ICD-10-CM | POA: Diagnosis not present

## 2021-02-14 ENCOUNTER — Other Ambulatory Visit: Payer: Self-pay | Admitting: Internal Medicine

## 2021-02-14 ENCOUNTER — Other Ambulatory Visit (HOSPITAL_COMMUNITY): Payer: Self-pay | Admitting: Internal Medicine

## 2021-02-14 DIAGNOSIS — S24113A Complete lesion at T7-T10 level of thoracic spinal cord, initial encounter: Secondary | ICD-10-CM

## 2021-02-15 ENCOUNTER — Other Ambulatory Visit (HOSPITAL_COMMUNITY): Payer: Self-pay | Admitting: Internal Medicine

## 2021-02-15 DIAGNOSIS — S24113A Complete lesion at T7-T10 level of thoracic spinal cord, initial encounter: Secondary | ICD-10-CM

## 2021-02-20 DIAGNOSIS — M12811 Other specific arthropathies, not elsewhere classified, right shoulder: Secondary | ICD-10-CM | POA: Diagnosis not present

## 2021-02-28 ENCOUNTER — Other Ambulatory Visit: Payer: Self-pay

## 2021-02-28 ENCOUNTER — Encounter (HOSPITAL_COMMUNITY)
Admission: RE | Admit: 2021-02-28 | Discharge: 2021-02-28 | Disposition: A | Discharge status: Home / Self Care | Payer: Medicare Other | Source: Ambulatory Visit | Attending: Internal Medicine | Admitting: Internal Medicine

## 2021-02-28 DIAGNOSIS — R9389 Abnormal findings on diagnostic imaging of other specified body structures: Secondary | ICD-10-CM | POA: Diagnosis not present

## 2021-02-28 DIAGNOSIS — S24113A Complete lesion at T7-T10 level of thoracic spinal cord, initial encounter: Secondary | ICD-10-CM | POA: Diagnosis not present

## 2021-02-28 DIAGNOSIS — Z8546 Personal history of malignant neoplasm of prostate: Secondary | ICD-10-CM | POA: Diagnosis not present

## 2021-02-28 DIAGNOSIS — M899 Disorder of bone, unspecified: Secondary | ICD-10-CM | POA: Diagnosis not present

## 2021-02-28 LAB — GLUCOSE, CAPILLARY: Glucose-Capillary: 90 mg/dL (ref 70–99)

## 2021-02-28 MED ORDER — FLUDEOXYGLUCOSE F - 18 (FDG) INJECTION
9.3000 | Freq: Once | INTRAVENOUS | Status: AC
  Administered 2021-02-28: 8.97 via INTRAVENOUS

## 2021-03-08 ENCOUNTER — Other Ambulatory Visit (HOSPITAL_COMMUNITY): Payer: Self-pay | Admitting: *Deleted

## 2021-03-08 DIAGNOSIS — I3139 Other pericardial effusion (noninflammatory): Secondary | ICD-10-CM

## 2021-03-08 DIAGNOSIS — R942 Abnormal results of pulmonary function studies: Secondary | ICD-10-CM | POA: Diagnosis not present

## 2021-03-08 DIAGNOSIS — D6869 Other thrombophilia: Secondary | ICD-10-CM | POA: Diagnosis not present

## 2021-03-08 DIAGNOSIS — R972 Elevated prostate specific antigen [PSA]: Secondary | ICD-10-CM | POA: Diagnosis not present

## 2021-03-08 DIAGNOSIS — Z6825 Body mass index (BMI) 25.0-25.9, adult: Secondary | ICD-10-CM | POA: Diagnosis not present

## 2021-03-08 DIAGNOSIS — I313 Pericardial effusion (noninflammatory): Secondary | ICD-10-CM

## 2021-03-11 DIAGNOSIS — Z23 Encounter for immunization: Secondary | ICD-10-CM | POA: Diagnosis not present

## 2021-03-14 DIAGNOSIS — Z23 Encounter for immunization: Secondary | ICD-10-CM | POA: Diagnosis not present

## 2021-03-19 ENCOUNTER — Other Ambulatory Visit (HOSPITAL_COMMUNITY): Payer: Self-pay | Admitting: Internal Medicine

## 2021-03-19 ENCOUNTER — Other Ambulatory Visit: Payer: Self-pay | Admitting: Internal Medicine

## 2021-03-19 DIAGNOSIS — R942 Abnormal results of pulmonary function studies: Secondary | ICD-10-CM

## 2021-03-20 ENCOUNTER — Telehealth (HOSPITAL_COMMUNITY): Payer: Self-pay

## 2021-03-20 ENCOUNTER — Other Ambulatory Visit (HOSPITAL_COMMUNITY): Payer: Self-pay | Admitting: Internal Medicine

## 2021-03-20 DIAGNOSIS — R942 Abnormal results of pulmonary function studies: Secondary | ICD-10-CM

## 2021-03-20 NOTE — Telephone Encounter (Signed)
-----   Message from Pedro Earls, MD sent at 03/20/2021 12:22 PM EDT ----- Regarding: FW: CT BIOPSY Did you guys see this one?   ----- Message ----- From: Sandi Mariscal, MD Sent: 03/20/2021   9:44 AM EDT To: Pedro Earls, MD, # Subject: RE: CT BIOPSY                                  OK for fluoro guided Bx of hypermetabolic lesion of T8.  Please set up at [facility] with Dr. Karenann Cai to perform.    Cathren Harsh   ----- Message ----- From: Valli Glance Sent: 03/19/2021   5:53 PM EDT To: Ir Procedure Requests Subject: CT BIOPSY                                      Procedure: CT BIOPSY   Reason: Abnormal PET scan, lung, Lesion on T8    Hx: PET and [place] in computer   Provider: Marilynne Halsted    Contact: [phone]

## 2021-03-21 ENCOUNTER — Ambulatory Visit (HOSPITAL_COMMUNITY)
Admission: RE | Admit: 2021-03-21 | Discharge: 2021-03-21 | Disposition: A | Discharge status: Home / Self Care | Payer: Medicare Other | Source: Ambulatory Visit | Attending: Physician Assistant | Admitting: Physician Assistant

## 2021-03-21 DIAGNOSIS — I10 Essential (primary) hypertension: Secondary | ICD-10-CM | POA: Diagnosis not present

## 2021-03-21 DIAGNOSIS — I3139 Other pericardial effusion (noninflammatory): Secondary | ICD-10-CM

## 2021-03-21 DIAGNOSIS — I4891 Unspecified atrial fibrillation: Secondary | ICD-10-CM | POA: Diagnosis not present

## 2021-03-21 DIAGNOSIS — I313 Pericardial effusion (noninflammatory): Secondary | ICD-10-CM | POA: Insufficient documentation

## 2021-03-21 NOTE — Progress Notes (Addendum)
  Echocardiogram 2D Echocardiogram has been performed. Results communicated to cardiology.  Edgar Wilkerson 03/21/2021, 10:04 AM

## 2021-03-22 ENCOUNTER — Telehealth (HOSPITAL_COMMUNITY): Payer: Self-pay | Admitting: *Deleted

## 2021-03-22 LAB — ECHOCARDIOGRAM COMPLETE
Area-P 1/2: 3.46 cm2
Calc EF: 54 %
S' Lateral: 2.7 cm
Single Plane A2C EF: 52.4 %
Single Plane A4C EF: 50.9 %

## 2021-03-22 NOTE — Telephone Encounter (Signed)
Late entry: 9/9 at 2pm.   Pt wife called stating PET scan reported out - Pericardial effusion, moderately large. Correlate with any new chest symptoms and consider echocardiography for further evaluation.  Pt PCP recommended pt needed echocardiogram but preferred cardiology ordered test. Order placed on 9/9 for echo and wife notified of procedure date.   03/22/21 - received phone call from Dr. Sallyanne Kuster reader of echocardiogram performed on 9/23 - large pericardial effusion - recommended follow up with DOD early next week. Appt requested. Wife notified.

## 2021-03-25 ENCOUNTER — Encounter: Payer: Self-pay | Admitting: Interventional Cardiology

## 2021-03-25 ENCOUNTER — Other Ambulatory Visit: Payer: Self-pay

## 2021-03-25 ENCOUNTER — Telehealth: Payer: Self-pay | Admitting: *Deleted

## 2021-03-25 ENCOUNTER — Ambulatory Visit (INDEPENDENT_AMBULATORY_CARE_PROVIDER_SITE_OTHER): Payer: Medicare Other | Admitting: Interventional Cardiology

## 2021-03-25 VITALS — BP 136/82 | HR 75 | Ht 71.0 in | Wt 179.6 lb

## 2021-03-25 DIAGNOSIS — I700 Atherosclerosis of aorta: Secondary | ICD-10-CM

## 2021-03-25 DIAGNOSIS — I313 Pericardial effusion (noninflammatory): Secondary | ICD-10-CM

## 2021-03-25 DIAGNOSIS — Z7901 Long term (current) use of anticoagulants: Secondary | ICD-10-CM | POA: Diagnosis not present

## 2021-03-25 DIAGNOSIS — E785 Hyperlipidemia, unspecified: Secondary | ICD-10-CM

## 2021-03-25 DIAGNOSIS — I4821 Permanent atrial fibrillation: Secondary | ICD-10-CM

## 2021-03-25 DIAGNOSIS — I3139 Other pericardial effusion (noninflammatory): Secondary | ICD-10-CM

## 2021-03-25 MED ORDER — ROSUVASTATIN CALCIUM 20 MG PO TABS: 20.0000 mg | ORAL_TABLET | Freq: Every day | ORAL | 3 refills | Status: DC

## 2021-03-25 NOTE — Telephone Encounter (Signed)
   [facility] Pre-operative Risk Assessment    Patient Name: Edgar Wilkerson  DOB: Dec 12, 1929 MRN: [id number]  HEARTCARE STAFF:  - IMPORTANT!!!!!! Under Visit Info/Reason for Call, type in Other and utilize the format Clearance MM/DD/YY or Clearance TBD. Do not use dashes or single digits. - Please review there is not already an duplicate clearance open for this procedure. - If request is for dental extraction, please clarify the # of teeth to be extracted. - If the patient is currently at the dentist's office, call Pre-Op Callback Staff (MA/nurse) to input urgent request.  - If the patient is not currently in the dentist office, please route to the Pre-Op pool.  Request for surgical clearance: PT HAS APPT TODAY WITH DR. VARANASI; WILL FORWARD CLEARANCE TO MD FOR APPT TODAY; WILL SEND FYI TO REQUESTING OFFICE PT HAS APPT TODAY.   What type of surgery is being performed? T 8 LESION Bx   When is this surgery scheduled? TBD  What type of clearance is required (medical clearance vs. Pharmacy clearance to hold med vs. Both)? BOTH  Are there any medications that need to be held prior to surgery and how long? ELIQUIS x 26 HOURS PRIOR TO PROCEDURE   Practice name and name of physician performing surgery? [facility]; DR. Erven Colla deMACEDO RODRIGUES  What is the office phone number? [phone]   7.   What is the office fax number? [phone]  8.   Anesthesia type (None, local, MAC, general) ? NOT LISTED    Julaine Hua 03/25/2021, 9:39 AM  _________________________________________________________________   (provider comments below)

## 2021-03-25 NOTE — Progress Notes (Signed)
Cardiology Office Note   Date:  03/25/2021   ID:  Edgar Wilkerson, DOB 27-Jun-1930, MRN [id number]  PCP:  Edgar Halsted, MD    No chief complaint on file.  Preoperative eval/pericardial effusion  Wt Readings from Last 3 Encounters:  03/25/21 179 lb 9.6 oz (81.5 kg)  01/21/21 180 lb (81.6 kg)  04/12/20 181 lb 6.4 oz (82.3 kg)       History of Present Illness: Edgar Wilkerson is a 87 y.o. male who is being seen today for the evaluation of preop clearance at the request of Edgar Halsted, MD.   He has chronic shoulder problems which required an injection.    He is followed in the atrial fibrillation clinic and has been for several years.  His 2021 visit revealed: "a history of asymptomatic persistent atrial fibrillation who presents for f/u in the [facility].  The patient was initially diagnosed with atrial fibrillation in 2014 and has been on anticoagulation with Eliquis since. He and his wife moved here from [facility] early in the year. He originally saw cardiology at [facility] but did not feel they had a good relationship and was referred to the AF clinic for management.  He reports that [place], he would ride his bike 4-5 miles per day. He hopes to be able to play more golf when the weather improves.He has very little awareness of his atrial fibrillation. Continues on eliquis. Some bruising but no bleeding issues. "  Pericardial effusion was noted on a PET scan.  Echocardiogram followed showing large pericardial effusion but no tamponade. "Left ventricular ejection fraction, by estimation, is 60 to 65%. Left  ventricular ejection fraction by 3D volume is 63 %. The left ventricle has  normal function. The left ventricle has no regional wall motion  abnormalities. There is mild concentric  left ventricular hypertrophy. Left ventricular diastolic function could  not be evaluated.   2. Right ventricular systolic function is  normal. The right ventricular  size is normal. There is normal pulmonary artery systolic pressure. The  estimated right ventricular systolic pressure is 74.1 mmHg.   3. Large pericardial effusion. The pericardial effusion is  circumferential. There is no evidence of cardiac tamponade.   4. The mitral valve is normal in structure. Trivial mitral valve  regurgitation. No evidence of mitral stenosis.   5. The aortic valve is tricuspid. Aortic valve regurgitation is not  visualized. No aortic stenosis is present.   6. The inferior vena cava is dilated in size with >50% respiratory  variability, suggesting right atrial pressure of 8 mmHg. "  Of note, PET scan showed some findings in the spine concerning for metastatic disease, potentially linked to prostate cancer.  PSA was recommended.  He needs preoperative clearance for T8 biopsy.  In July 2022, he had cellulitis after a derm procedure.  He was treated with antibiotics, but continued to have LE swelling.  Lasix was started for 5 days.  In 02/2021, he was still retaining fluid.   He remains active, still walks.  Golf for a long time, but has been limited by shoulder and back pain.    Denies : Chest pain. Dizziness. Leg edema. Nitroglycerin use. Orthopnea. Palpitations. Paroxysmal nocturnal dyspnea. Syncope.     Past Medical History:  Diagnosis Date   BPH (benign prostatic hyperplasia)    Carotid artery stenosis    a. s/p R CEA   Carpal tunnel syndrome  Hypertension    Persistent atrial fibrillation ([facility])    Spinal stenosis     Past Surgical History:  Procedure Laterality Date   BACK SURGERY Bilateral    x 3    CAROTID ARTERY - SUBCLAVIAN ARTERY BYPASS GRAFT     CARPAL TUNNEL RELEASE     CATARACT EXTRACTION     ROTATOR CUFF REPAIR       Current Outpatient Medications  Medication Sig Dispense Refill   acetaminophen (TYLENOL) 500 MG tablet Take 1,000 mg by mouth as needed.     apixaban (ELIQUIS) 5 MG TABS tablet Take 1 tablet  by mouth 2 (two) times daily.     carvedilol (COREG) 12.5 MG tablet Take 1 tablet by mouth 2 (two) times daily.     cephALEXin (KEFLEX) 500 MG capsule Take 500 mg by mouth as needed.     colchicine 0.6 MG tablet Take 0.6 mg by mouth as needed.      fluorouracil (EFUDEX) 5 % cream Apply topically as needed.      hydrOXYzine (ATARAX/VISTARIL) 25 MG tablet take 1 tablet by oral route 2 times every day AS NEEDED FOR ITCHING     lisinopril (ZESTRIL) 20 MG tablet Take 20 mg by mouth 2 (two) times daily.     tamsulosin (FLOMAX) 0.4 MG CAPS capsule Take 0.4 mg by mouth daily.     HYDROcodone-acetaminophen (NORCO/VICODIN) 5-325 MG tablet Take 1 tablet by mouth as needed.  (Patient not taking: Reported on 03/25/2021)     rosuvastatin (CRESTOR) 20 MG tablet Take 1 tablet (20 mg total) by mouth daily. 90 tablet 3   No current facility-administered medications for this visit.    Allergies:   Patient has no known allergies.    Social History:  The patient  reports that he has quit smoking. He has never used smokeless tobacco. He reports current alcohol use of about 5.0 standard drinks per week.   Family History:  The patient's family history includes Heart disease in his brother and father.    ROS:  Please see the history of present illness.   Otherwise, review of systems are positive for back pain.   All other systems are reviewed and negative.    PHYSICAL EXAM: VS:  BP 136/82   Pulse 75   Ht 5\' 11"  (1.803 m)   Wt 179 lb 9.6 oz (81.5 kg)   SpO2 95%   BMI 25.05 kg/m  , BMI Body mass index is 25.05 kg/m. GEN: Well nourished, well developed, in no acute distress HEENT: normal Neck: no JVD, carotid bruits, or masses Cardiac: RRR; no murmurs, rubs, or gallops,no edema  Respiratory:  clear to auscultation bilaterally, normal work of breathing GI: soft, nontender, nondistended, + BS MS: no deformity or atrophy Skin: warm and dry, no rash Neuro:  Strength and sensation are intact Psych: euthymic  mood, full affect   EKG:   The ekg ordered today demonstrates    Recent Labs: No results found for requested labs within last 8760 hours.   Lipid Panel No results found for: CHOL, TRIG, HDL, CHOLHDL, VLDL, LDLCALC, LDLDIRECT   Other studies Reviewed: Additional studies/ records that were reviewed today with results demonstrating: echo images personally reviewed.   ASSESSMENT AND PLAN:  Atrial fibrillation: Heart rate well controlled.  Eliquis for stroke prevention. Aortic atherosclerosis/coronary artery calcification: Both noted by CT scan.  PAD - subclavian disease.  Stop simvastatin.  Start rosuvastatin 20 mg mg daily for hyperlipidemia.  Check labs in 3  months. Pericardial effusion: Pericardial effusion appears mostly posterior by echocardiogram.  No evidence of tamponade.  He has had several inflammatory processes including cellulitis after Derm procedure.  Now with a lesion in his spine.  Certainly the effusion could be worsened due to his anticoagulation.  Given its location, it is not optimal for pericardiocentesis.  It is more posterior in nature.  Will repeat limited echocardiogram in 3 to 4 weeks to see if it has changed. Anticoagulated: Eliquis for stroke prevention.    Current medicines are reviewed at length with the patient today.  The patient concerns regarding his medicines were addressed.  The following changes have been made:  No change  Labs/ tests ordered today include:   Orders Placed This Encounter  Procedures   Lipid Profile   Hepatic function panel   EKG 12-Lead   ECHOCARDIOGRAM LIMITED     Recommend 150 minutes/week of aerobic exercise Low fat, low carb, high fiber diet recommended  Disposition:   FU in 6 months with me.  Limited Echo in 3-4 weeks for pericardial effusion.   Signed, Larae Grooms, MD  03/25/2021 4:34 PM    [facility] [place], [place], [place]  [address] Phone: [phone]; Fax: [phone]

## 2021-03-25 NOTE — Telephone Encounter (Signed)
Will route to pharm for input on Eliquis then should be ready to bundle clearance from Dr. Irish Lack.

## 2021-03-25 NOTE — Patient Instructions (Signed)
Medication Instructions:  Your physician has recommended you make the following change in your medication: Stop Simvastatin. Start Rosuvastatin 20 mg by mouth daily  *If you need a refill on your cardiac medications before your next appointment, please call your pharmacy*   Lab Work: Your physician recommends that you return for lab work in: 3 months.  Lipid and Liver profiles.  This will be fasting  If you have labs (blood work) drawn today and your tests are completely normal, you will receive your results only by: [facility] (if you have MyChart) OR A paper copy in the mail If you have any lab test that is abnormal or we need to change your treatment, we will call you to review the results.   Testing/Procedures: Your physician has requested that you have an echocardiogram. Echocardiography is a painless test that uses sound waves to create images of your heart. It provides your doctor with information about the size and shape of your heart and how well your heart's chambers and valves are working. This procedure takes approximately one hour. There are no restrictions for this procedure. To be done in 3-4 weeks.     Follow-Up: At [facility], you and your health needs are our priority.  As part of our continuing mission to provide you with exceptional heart care, we have created designated Provider Care Teams.  These Care Teams include your primary Cardiologist (physician) and Advanced Practice Providers (APPs -  Physician Assistants and Nurse Practitioners) who all work together to provide you with the care you need, when you need it.  We recommend signing up for the patient portal called "MyChart".  Sign up information is provided on this After Visit Summary.  MyChart is used to connect with patients for Virtual Visits (Telemedicine).  Patients are able to view lab/test results, encounter notes, upcoming appointments, etc.  Non-urgent messages can be sent to your provider as  well.   To learn more about what you can do with MyChart, go to [email or web address].    Your next appointment:   6 month(s)  The format for your next appointment:   In Person  Provider:   You may see Larae Grooms, MD or one of the following Advanced Practice Providers on your designated Care Team:   Melina Copa, PA-C Ermalinda Barrios, PA-C   Other Instructions

## 2021-03-25 NOTE — Telephone Encounter (Signed)
No further cardiac testing needed before T8 biopsy.  JV

## 2021-03-26 NOTE — Telephone Encounter (Signed)
Patient with diagnosis of atrial fibrillation on Eliquis for anticoagulation.    Procedure: T8 lesion biopsy Date of procedure: TBD   CHA2DS2-VASc Score = 3   This indicates a 3.2% annual risk of stroke. The patient's score is based upon: CHF History: 0 HTN History: 1 Diabetes History: 0 Stroke History: 0 Vascular Disease History: 0 Age Score: 2 Gender Score: 0   CrCl 48 Platelet count 160  Per office protocol, patient can hold Eliquis for 3 days prior to procedure.   Patient will not need bridging with Lovenox (enoxaparin) around procedure.  Patient should restart Eliquis on the evening of procedure or day after, at discretion of procedure MD  Please note that for any spinal procedure we require a 3 day hold on anticoagulation.

## 2021-03-26 NOTE — Telephone Encounter (Signed)
    Patient Name: Edgar Wilkerson  DOB: 02-03-1930 MRN: [id number]  Primary Cardiologist: Larae Grooms, MD  Chart reviewed as part of pre-operative protocol coverage. Given past medical history and time since last visit, based on ACC/AHA guidelines, Nevyn Bossman would be at acceptable risk for the planned procedure without further cardiovascular testing.   Patient with diagnosis of atrial fibrillation on Eliquis for anticoagulation.     Procedure: T8 lesion biopsy Date of procedure: TBD   CHA2DS2-VASc Score = 3   This indicates a 3.2% annual risk of stroke. The patient's score is based upon: CHF History: 0 HTN History: 1 Diabetes History: 0 Stroke History: 0 Vascular Disease History: 0 Age Score: 2 Gender Score: 0    CrCl 48 Platelet count 160   Per office protocol, patient can hold Eliquis for 3 days prior to procedure. Patient will not need bridging with Lovenox (enoxaparin) around procedure.   Patient should restart Eliquis on the evening of procedure or day after, at discretion of procedure MD  Please note that for any spinal procedure we require a 3 day hold on anticoagulation.   The patient was advised that if he develops new symptoms prior to surgery to contact our office to arrange for a follow-up visit, and he verbalized understanding.  I will route this recommendation to the requesting party via Epic fax function and remove from pre-op pool.  Please call with questions.  Kathyrn Drown, NP 03/26/2021, 9:13 AM

## 2021-03-27 ENCOUNTER — Telehealth: Payer: Self-pay | Admitting: Interventional Cardiology

## 2021-03-27 NOTE — Telephone Encounter (Signed)
I spoke with patient. He started Rosuvastatin last night. Has chronic back pain and has had many back surgeries. Today is having more back pain. Also having pain in left knee cap area. He thinks it is connected to his back pain. He also started riding a stationary bike 15 minutes a day this week.  I told patient back and knee pain did not sound like it was related to one dose of Rosuvastatin.  He will continue Rosuvastatin and follow up with PCP regarding back and knee pain

## 2021-03-27 NOTE — Telephone Encounter (Signed)
Pt c/o medication issue:  1. Name of Medication: rosuvastatin (CRESTOR) 20 MG tablet  2. How are you currently taking this medication (dosage and times per day)? Take 1 tablet (20 mg total) by mouth daily.  3. Are you having a reaction (difficulty breathing--STAT)? pain in knee cap area and back   4. What is your medication issue? Since taking medication pt is having pain in knee cap area and back

## 2021-04-01 ENCOUNTER — Ambulatory Visit: Payer: Medicare Other | Admitting: Family Medicine

## 2021-04-01 ENCOUNTER — Other Ambulatory Visit: Payer: Self-pay

## 2021-04-01 ENCOUNTER — Other Ambulatory Visit: Payer: Medicare Other | Admitting: *Deleted

## 2021-04-01 DIAGNOSIS — E785 Hyperlipidemia, unspecified: Secondary | ICD-10-CM | POA: Diagnosis not present

## 2021-04-01 LAB — HEPATIC FUNCTION PANEL
ALT: 17 IU/L (ref 0–44)
AST: 21 IU/L (ref 0–40)
Albumin: 3.7 g/dL (ref 3.6–4.6)
Alkaline Phosphatase: 76 IU/L (ref 44–121)
Bilirubin Total: 0.4 mg/dL (ref 0.0–1.2)
Bilirubin, Direct: 0.14 mg/dL (ref 0.00–0.40)
Total Protein: 5.6 g/dL — ABNORMAL LOW (ref 6.0–8.5)

## 2021-04-01 LAB — LIPID PANEL
Chol/HDL Ratio: 2.2 ratio (ref 0.0–5.0)
Cholesterol, Total: 107 mg/dL (ref 100–199)
HDL: 48 mg/dL (ref >39)
LDL Chol Calc (NIH): 48 mg/dL (ref 0–99)
Triglycerides: 45 mg/dL (ref 0–149)
VLDL Cholesterol Cal: 11 mg/dL (ref 5–40)

## 2021-04-02 ENCOUNTER — Other Ambulatory Visit (HOSPITAL_COMMUNITY): Payer: Self-pay | Admitting: Physician Assistant

## 2021-04-03 ENCOUNTER — Other Ambulatory Visit: Payer: Self-pay

## 2021-04-03 ENCOUNTER — Ambulatory Visit (HOSPITAL_COMMUNITY)
Admission: RE | Admit: 2021-04-03 | Discharge: 2021-04-03 | Disposition: A | Discharge status: Home / Self Care | Payer: Medicare Other | Source: Ambulatory Visit | Attending: Internal Medicine | Admitting: Internal Medicine

## 2021-04-03 DIAGNOSIS — G8929 Other chronic pain: Secondary | ICD-10-CM | POA: Insufficient documentation

## 2021-04-03 DIAGNOSIS — Z79899 Other long term (current) drug therapy: Secondary | ICD-10-CM | POA: Insufficient documentation

## 2021-04-03 DIAGNOSIS — M899 Disorder of bone, unspecified: Secondary | ICD-10-CM | POA: Diagnosis not present

## 2021-04-03 DIAGNOSIS — Z7901 Long term (current) use of anticoagulants: Secondary | ICD-10-CM | POA: Diagnosis not present

## 2021-04-03 DIAGNOSIS — R942 Abnormal results of pulmonary function studies: Secondary | ICD-10-CM

## 2021-04-03 DIAGNOSIS — M549 Dorsalgia, unspecified: Secondary | ICD-10-CM | POA: Insufficient documentation

## 2021-04-03 DIAGNOSIS — M545 Low back pain, unspecified: Secondary | ICD-10-CM | POA: Diagnosis not present

## 2021-04-03 HISTORY — PX: IR FLUORO GUIDED NEEDLE PLC ASPIRATION/INJECTION LOC: IMG2395

## 2021-04-03 LAB — CBC
HCT: 40.6 % (ref 39.0–52.0)
Hemoglobin: 13 g/dL (ref 13.0–17.0)
MCH: 32.3 pg (ref 26.0–34.0)
MCHC: 32 g/dL (ref 30.0–36.0)
MCV: 101 fL — ABNORMAL HIGH (ref 80.0–100.0)
Platelets: 192 10*3/uL (ref 150–400)
RBC: 4.02 MIL/uL — ABNORMAL LOW (ref 4.22–5.81)
RDW: 12.1 % (ref 11.5–15.5)
WBC: 7 10*3/uL (ref 4.0–10.5)
nRBC: 0 % (ref 0.0–0.2)

## 2021-04-03 LAB — PROTIME-INR
INR: 1.2 (ref 0.8–1.2)
Prothrombin Time: 15.1 seconds (ref 11.4–15.2)

## 2021-04-03 MED ORDER — FENTANYL CITRATE (PF) 100 MCG/2ML IJ SOLN
INTRAMUSCULAR | Status: AC
  Filled 2021-04-03: qty 2

## 2021-04-03 MED ORDER — CEFAZOLIN SODIUM-DEXTROSE 2-4 GM/100ML-% IV SOLN: 2.0000 g | Freq: Once | INTRAVENOUS | Status: AC

## 2021-04-03 MED ORDER — BUPIVACAINE HCL (PF) 0.5 % IJ SOLN
INTRAMUSCULAR | Status: AC
  Filled 2021-04-03: qty 30

## 2021-04-03 MED ORDER — SODIUM CHLORIDE 0.9 % IV SOLN: INTRAVENOUS | Status: DC

## 2021-04-03 MED ORDER — CEFAZOLIN SODIUM-DEXTROSE 2-4 GM/100ML-% IV SOLN
INTRAVENOUS | Status: AC
  Administered 2021-04-03: 2 g via INTRAVENOUS
  Filled 2021-04-03: qty 100

## 2021-04-03 MED ORDER — FENTANYL CITRATE (PF) 100 MCG/2ML IJ SOLN
INTRAMUSCULAR | Status: DC | PRN
  Administered 2021-04-03 (×2): 25 ug via INTRAVENOUS

## 2021-04-03 MED ORDER — LIDOCAINE HCL 1 % IJ SOLN
INTRAMUSCULAR | Status: AC
  Filled 2021-04-03: qty 20

## 2021-04-03 MED ORDER — MIDAZOLAM HCL 2 MG/2ML IJ SOLN
INTRAMUSCULAR | Status: DC | PRN
  Administered 2021-04-03 (×2): 1 mg via INTRAVENOUS

## 2021-04-03 MED ORDER — MIDAZOLAM HCL 2 MG/2ML IJ SOLN
INTRAMUSCULAR | Status: AC
  Filled 2021-04-03: qty 2

## 2021-04-03 NOTE — Progress Notes (Signed)
Per Dr Tennis Must Sindy Messing client may resume eliquis tomorrow

## 2021-04-03 NOTE — Discharge Instructions (Signed)
Resume Eliquis tomorrow.  

## 2021-04-03 NOTE — H&P (Signed)
Chief Complaint: Patient was seen in consultation today for Thoracic 8 bone lesion biopsy at the request of Hines,Jonathan S  Referring Physician(s): Hines,Jonathan S  Supervising Physician: Pedro Earls  Patient Status: [facility] - Out-pt  History of Present Illness: Edgar Wilkerson is a 87 y.o. male   Pt has had chronic back pain for years Many surgeries and steroid injections Recently  (July 2022)- was seen for "new back pain device" Needed MRI before eligible for same No Ca Hx Denies new onset or change in back pain symptoms Denies bowel or urinary changes  MRI was noted to denote new T8 lesion (outside facility-- not available in [facility]  for me) PET: 02/28/21 IMPRESSION: Hypermetabolic area corresponding to the abnormality described on the previous MRI. Recommend comparison with the MRI Findings are suspicious for metastatic disease without visible CT correlate.      Subtle area of increased metabolic activity in the RIGHT lateral second rib without corresponding abnormality on CT.      Pericardial effusion, moderately large. Correlate with any new chest symptoms and consider echocardiography for further evaluation. Density values nonspecific, less than 20 Hounsfield units more suggestive of simple or nearly simple fluid and less than would be expected for blood.  Scheduled now for T8 bone lesion biopsy Imaging reviewed by Dr Debbrah Alar--- she approves procedure  Past Medical History:  Diagnosis Date   BPH (benign prostatic hyperplasia)    Carotid artery stenosis    a. s/p R CEA   Carpal tunnel syndrome    Hypertension    Persistent atrial fibrillation (HCC)    Spinal stenosis     Past Surgical History:  Procedure Laterality Date   BACK SURGERY Bilateral    x 3    CAROTID ARTERY - SUBCLAVIAN ARTERY BYPASS GRAFT     CARPAL TUNNEL RELEASE     CATARACT EXTRACTION     ROTATOR CUFF REPAIR      Allergies: Patient has no known  allergies.  Medications: Prior to Admission medications   Medication Sig Start Date End Date Taking? Authorizing Provider  acetaminophen (TYLENOL) 500 MG tablet Take 1,000 mg by mouth every 6 (six) hours as needed for moderate pain or headache.   Yes [provider]  alum & mag hydroxide-simeth (MAALOX/MYLANTA) 200-200-20 MG/5ML suspension Take 15 mLs by mouth every 6 (six) hours as needed for indigestion or heartburn.   Yes [provider]  apixaban (ELIQUIS) 5 MG TABS tablet Take 1 tablet by mouth 2 (two) times daily. 10/21/17  Yes [provider]  buPROPion (WELLBUTRIN XL) 150 MG 24 hr tablet Take 150 mg by mouth daily.   Yes [provider]  carvedilol (COREG) 25 MG tablet Take 25 mg by mouth 2 (two) times daily with a meal.   Yes [provider]  colchicine 0.6 MG tablet Take 0.6-1.2 mg by mouth See admin instructions. Take 1.2 mg as needed at the first onset of gout, then take 0.6 mg daily until gout flare subsides 11/17/19  Yes [provider]  hydrOXYzine (ATARAX/VISTARIL) 25 MG tablet Take 25 mg by mouth 2 (two) times daily as needed for itching. 09/19/19  Yes [provider]  lisinopril (ZESTRIL) 10 MG tablet Take 20 mg by mouth 2 (two) times daily.   Yes [provider]  rosuvastatin (CRESTOR) 20 MG tablet Take 1 tablet (20 mg total) by mouth daily. 03/25/21  Yes Jettie Booze, MD  tamsulosin (FLOMAX) 0.4 MG CAPS capsule Take 0.4 mg by  mouth daily.   Yes [provider]     Family History  Problem Relation Age of Onset   Heart disease Father    Heart disease Brother     Social History   Socioeconomic History   Marital status: Married    Spouse name: Not on file   Number of children: Not on file   Years of education: Not on file   Highest education level: Not on file  Occupational History   Not on file  Tobacco Use   Smoking status: Former   Smokeless tobacco: Never  Substance and Sexual  Activity   Alcohol use: Yes    Alcohol/week: 5.0 standard drinks    Types: 5 Glasses of wine per week   Drug use: Not on file   Sexual activity: Not on file  Other Topics Concern   Not on file  Social History Narrative   Not on file   Social Determinants of Health   Financial Resource Strain: Not on file  Food Insecurity: Not on file  Transportation Needs: Not on file  Physical Activity: Not on file  Stress: Not on file  Social Connections: Not on file    Review of Systems: A 12 point ROS discussed and pertinent positives are indicated in the HPI above.  All other systems are negative.  Review of Systems  Constitutional:  Negative for activity change, fatigue and fever.  Respiratory:  Negative for cough and shortness of breath.   Cardiovascular:  Negative for chest pain.  Gastrointestinal:  Negative for nausea and vomiting.  Musculoskeletal:  Positive for back pain.  Neurological:  Negative for weakness.  Psychiatric/Behavioral:  Negative for behavioral problems and confusion.    Vital Signs: BP (!) 169/96   Pulse 77   Temp 97.6 F (36.4 C) (Oral)   Resp 15   Ht 5\' 11"  (1.803 m)   Wt 175 lb (79.4 kg)   SpO2 99%   BMI 24.41 kg/m   Physical Exam Vitals reviewed.  HENT:     Mouth/Throat:     Mouth: Mucous membranes are moist.  Cardiovascular:     Rate and Rhythm: Normal rate and regular rhythm.     Heart sounds: Normal heart sounds.  Pulmonary:     Effort: Pulmonary effort is normal.     Breath sounds: Normal breath sounds.  Abdominal:     Palpations: Abdomen is soft.     Tenderness: There is no abdominal tenderness.  Musculoskeletal:        General: Normal range of motion.  Skin:    General: Skin is warm.  Neurological:     Mental Status: He is alert and oriented to person, place, and time.  Psychiatric:        Behavior: Behavior normal.    Imaging: ECHOCARDIOGRAM COMPLETE  Result Date: 03/22/2021    ECHOCARDIOGRAM REPORT   Patient Name:   Edgar Wilkerson Date of Exam: 03/21/2021 Medical Rec #:  [id number]   Height:       71.0 in Accession #:    [id number]  Weight:       180.0 lb Date of Birth:  11-20-1929   BSA:          2.016 m Patient Age:    34 years    BP:           160/89 mmHg Patient Gender: M           HR:  70 bpm. Exam Location:  Outpatient Procedure: 2D Echo, 3D Echo, Cardiac Doppler and Color Doppler REPORT CONTAINS CRITICAL RESULT Indications:    I31.3 Pericardial effusion (noninflammatory)  History:        Patient has prior history of Echocardiogram examinations, most                 recent 04/19/2018. Abnormal ECG, Arrythmias:Atrial Fibrillation;                 Risk Factors:Hypertension.  Sonographer:    Roseanna Rainbow RDCS Referring Phys: [id number] [place]  1. Left ventricular ejection fraction, by estimation, is 60 to 65%. Left ventricular ejection fraction by 3D volume is 63 %. The left ventricle has normal function. The left ventricle has no regional wall motion abnormalities. There is mild concentric left ventricular hypertrophy. Left ventricular diastolic function could not be evaluated.  2. Right ventricular systolic function is normal. The right ventricular size is normal. There is normal pulmonary artery systolic pressure. The estimated right ventricular systolic pressure is 15.7 mmHg.  3. Large pericardial effusion. The pericardial effusion is circumferential. There is no evidence of cardiac tamponade.  4. The mitral valve is normal in structure. Trivial mitral valve regurgitation. No evidence of mitral stenosis.  5. The aortic valve is tricuspid. Aortic valve regurgitation is not visualized. No aortic stenosis is present.  6. The inferior vena cava is dilated in size with >50% respiratory variability, suggesting right atrial pressure of 8 mmHg. FINDINGS  Left Ventricle: Left ventricular ejection fraction, by estimation, is 60 to 65%. Left ventricular ejection fraction by 3D volume is 63 %. The left ventricle has  normal function. The left ventricle has no regional wall motion abnormalities. Global longitudinal strain performed but not reported based on interpreter judgement due to suboptimal tracking. The left ventricular internal cavity size was normal in size. There is mild concentric left ventricular hypertrophy. Left ventricular diastolic function could not be evaluated due to atrial fibrillation. Left ventricular diastolic function could not be evaluated. Right Ventricle: The right ventricular size is normal. No increase in right ventricular wall thickness. Right ventricular systolic function is normal. There is normal pulmonary artery systolic pressure. The tricuspid regurgitant velocity is 2.43 m/s, and  with an assumed right atrial pressure of 8 mmHg, the estimated right ventricular systolic pressure is 26.2 mmHg. Left Atrium: Left atrial size was normal in size. Right Atrium: Right atrial size was normal in size. Pericardium: A large pericardial effusion is present. The pericardial effusion is circumferential. There is no evidence of cardiac tamponade. Mitral Valve: The mitral valve is normal in structure. Trivial mitral valve regurgitation. No evidence of mitral valve stenosis. Tricuspid Valve: The tricuspid valve is normal in structure. Tricuspid valve regurgitation is trivial. No evidence of tricuspid stenosis. Aortic Valve: The aortic valve is tricuspid. Aortic valve regurgitation is not visualized. No aortic stenosis is present. Pulmonic Valve: The pulmonic valve was normal in structure. Pulmonic valve regurgitation is not visualized. No evidence of pulmonic stenosis. Aorta: The aortic root is normal in size and structure. Venous: The inferior vena cava is dilated in size with greater than 50% respiratory variability, suggesting right atrial pressure of 8 mmHg. IAS/Shunts: No atrial level shunt detected by color flow Doppler.  LEFT VENTRICLE PLAX 2D LVIDd:         4.00 cm LVIDs:         2.70 cm LV PW:          1.20 cm  3D Volume EF LV IVS:        1.30 cm         LV 3D EF:    Left LVOT diam:     2.00 cm                      ventricul LV SV:         43                           ar LV SV Index:   21                           ejection LVOT Area:     3.14 cm                     fraction                                             by 3D                                             volume is LV Volumes (MOD)                            63 %. LV vol d, MOD    58.8 ml A2C: LV vol d, MOD    59.7 ml       3D Volume EF: A4C:                           3D EF:        63 % LV vol s, MOD    28.0 ml A2C: LV vol s, MOD    29.3 ml A4C: LV SV MOD A2C:   30.8 ml LV SV MOD A4C:   59.7 ml LV SV MOD BP:    34.3 ml RIGHT VENTRICLE            IVC RV S prime:     6.20 cm/s  IVC diam: 2.40 cm TAPSE (M-mode): 0.9 cm LEFT ATRIUM             Index       RIGHT ATRIUM           Index LA diam:        3.50 cm 1.74 cm/m  RA Area:     24.30 cm LA Vol (A2C):   55.7 ml 27.63 ml/m RA Volume:   65.70 ml  32.59 ml/m LA Vol (A4C):   38.8 ml 19.24 ml/m LA Biplane Vol: 50.9 ml 25.25 ml/m  AORTIC VALVE LVOT Vmax:   75.40 cm/s LVOT Vmean:  45.800 cm/s LVOT VTI:    0.136 m  AORTA Ao Root diam: 3.40 cm Ao Asc diam:  3.60 cm MITRAL VALVE                TRICUSPID VALVE MV Area (PHT): 3.46 cm     TR Peak grad:   23.6 mmHg MV Decel Time: 219 msec     TR Vmax:  243.00 cm/s MV E velocity: 120.50 cm/s                             SHUNTS                             Systemic VTI:  0.14 m                             Systemic Diam: 2.00 cm Mihai Croitoru MD Electronically signed by Sanda Klein MD Signature Date/Time: 03/22/2021/1:08:34 PM    Final     Labs:  CBC: No results for input(s): WBC, HGB, HCT, PLT in the last 8760 hours.  COAGS: No results for input(s): INR, APTT in the last 8760 hours.  BMP: No results for input(s): NA, K, CL, CO2, GLUCOSE, BUN, CALCIUM, CREATININE, GFRNONAA, GFRAA in the last 8760 hours.  Invalid input(s): CMP  LIVER  FUNCTION TESTS: Recent Labs    04/01/21 0946  BILITOT 0.4  AST 21  ALT 17  ALKPHOS 76  PROT 5.6*  ALBUMIN 3.7    TUMOR MARKERS: No results for input(s): AFPTM, CEA, CA199, CHROMGRNA in the last 8760 hours.  Assessment and Plan:  Chronic back pain Imaging performed prior to back procedure discovered Thoracic 8 lesion +PET Now scheduled for Thoracic 8 lesion biopsy Risks and benefits of Thoracic 8 lesion biopsy was discussed with the patient and/or patient's family including, but not limited to bleeding, infection, damage to adjacent structures or low yield requiring additional tests.  All of the questions were answered and there is agreement to proceed. Consent signed and in chart.   Thank you for this interesting consult.  I greatly enjoyed meeting Edgar Wilkerson and look forward to participating in their care.  A copy of this report was sent to the requesting provider on this date.  Electronically Signed: Lavonia Drafts, PA-C 04/03/2021, 10:43 AM   I spent a total of  30 Minutes   in face to face in clinical consultation, greater than 50% of which was counseling/coordinating care for T8 lesion bx

## 2021-04-03 NOTE — Procedures (Signed)
INTERVENTIONAL NEURORADIOLOGY BRIEF POSTPROCEDURE NOTE  Fluoroscopy guided T8 vertebral body core bone biopsy   Attending: Dr. Pedro Earls  Assistant: None.   Diagnosis: FDG PET avid T8 vertebral body lesion.   Access site: Percutaneous left transpedicular.   Anesthesia: Moderate sedation.   Medication used: 1.5 mg Versed IV; 50 mcg Fentanyl IV.  Complications: None.   Estimated blood loss: Negligible.   Specimen: 2 core biopsy samples sent for tissue exam.   The patient tolerated the procedure well without incident or complication and is in stable condition.

## 2021-04-10 ENCOUNTER — Ambulatory Visit (HOSPITAL_COMMUNITY): Payer: Medicare Other | Admitting: Nurse Practitioner

## 2021-04-11 LAB — SURGICAL PATHOLOGY

## 2021-04-22 DIAGNOSIS — C4441 Basal cell carcinoma of skin of scalp and neck: Secondary | ICD-10-CM | POA: Diagnosis not present

## 2021-04-22 DIAGNOSIS — R9389 Abnormal findings on diagnostic imaging of other specified body structures: Secondary | ICD-10-CM | POA: Diagnosis not present

## 2021-04-22 DIAGNOSIS — Z6825 Body mass index (BMI) 25.0-25.9, adult: Secondary | ICD-10-CM | POA: Diagnosis not present

## 2021-04-24 ENCOUNTER — Ambulatory Visit (HOSPITAL_COMMUNITY): Payer: Medicare Other | Attending: Cardiology

## 2021-04-24 ENCOUNTER — Telehealth: Payer: Self-pay | Admitting: Interventional Cardiology

## 2021-04-24 ENCOUNTER — Other Ambulatory Visit: Payer: Self-pay

## 2021-04-24 DIAGNOSIS — I3139 Other pericardial effusion (noninflammatory): Secondary | ICD-10-CM | POA: Diagnosis not present

## 2021-04-24 LAB — ECHOCARDIOGRAM LIMITED
Area-P 1/2: 3.84 cm2
S' Lateral: 2.6 cm

## 2021-04-24 NOTE — Telephone Encounter (Signed)
   Pt c/o medication issue:  1. Name of Medication: Furosemide  2. How are you currently taking this medication (dosage and times per day)?   3. Are you having a reaction (difficulty breathing--STAT)?   4. What is your medication issue? Pt would like to clarify if he needs to take Furosemide everyday or only if he has swelling

## 2021-04-24 NOTE — Telephone Encounter (Signed)
Patient wants Dr. Irish Lack to advise on if he should be taking lasix daily or keep taking as needed for edema. Patient's PCP prescribed lasix 40 mg PRN. Asked patient to call PCP, but he wants Dr. Hassell Done input. Patient had limited echo today, last one showed "Normal heart pumping function EF 60-65%. Large pericardial effusion." Patient stated he has a tiny bite of swelling in his ankles, but not much.

## 2021-04-24 NOTE — Telephone Encounter (Signed)
OK to take Lasix 40 mg PO prn

## 2021-04-25 ENCOUNTER — Telehealth: Payer: Self-pay | Admitting: Interventional Cardiology

## 2021-04-25 NOTE — Telephone Encounter (Signed)
Patient was returning a phone call from the triage

## 2021-04-25 NOTE — Telephone Encounter (Signed)
Left the pt a message to call the office back. 

## 2021-04-25 NOTE — Telephone Encounter (Signed)
Called patient back with Dr. Hassell Done recommendations. Patient verbalized understanding.

## 2021-05-01 DIAGNOSIS — I3139 Other pericardial effusion (noninflammatory): Secondary | ICD-10-CM

## 2021-05-20 DIAGNOSIS — L578 Other skin changes due to chronic exposure to nonionizing radiation: Secondary | ICD-10-CM | POA: Diagnosis not present

## 2021-05-20 DIAGNOSIS — D2372 Other benign neoplasm of skin of left lower limb, including hip: Secondary | ICD-10-CM | POA: Diagnosis not present

## 2021-05-20 DIAGNOSIS — L298 Other pruritus: Secondary | ICD-10-CM | POA: Diagnosis not present

## 2021-05-20 DIAGNOSIS — C44219 Basal cell carcinoma of skin of left ear and external auricular canal: Secondary | ICD-10-CM | POA: Diagnosis not present

## 2021-05-20 DIAGNOSIS — L820 Inflamed seborrheic keratosis: Secondary | ICD-10-CM | POA: Diagnosis not present

## 2021-05-20 DIAGNOSIS — L821 Other seborrheic keratosis: Secondary | ICD-10-CM | POA: Diagnosis not present

## 2021-05-20 DIAGNOSIS — D485 Neoplasm of uncertain behavior of skin: Secondary | ICD-10-CM | POA: Diagnosis not present

## 2021-05-20 DIAGNOSIS — R58 Hemorrhage, not elsewhere classified: Secondary | ICD-10-CM | POA: Diagnosis not present

## 2021-05-20 DIAGNOSIS — L538 Other specified erythematous conditions: Secondary | ICD-10-CM | POA: Diagnosis not present

## 2021-05-20 DIAGNOSIS — L570 Actinic keratosis: Secondary | ICD-10-CM | POA: Diagnosis not present

## 2021-05-20 DIAGNOSIS — L814 Other melanin hyperpigmentation: Secondary | ICD-10-CM | POA: Diagnosis not present

## 2021-05-20 DIAGNOSIS — C44712 Basal cell carcinoma of skin of right lower limb, including hip: Secondary | ICD-10-CM | POA: Diagnosis not present

## 2021-05-20 DIAGNOSIS — Z7189 Other specified counseling: Secondary | ICD-10-CM | POA: Diagnosis not present

## 2021-05-21 DIAGNOSIS — M533 Sacrococcygeal disorders, not elsewhere classified: Secondary | ICD-10-CM | POA: Diagnosis not present

## 2021-05-21 DIAGNOSIS — M4316 Spondylolisthesis, lumbar region: Secondary | ICD-10-CM | POA: Diagnosis not present

## 2021-05-21 DIAGNOSIS — M2578 Osteophyte, vertebrae: Secondary | ICD-10-CM | POA: Diagnosis not present

## 2021-05-21 DIAGNOSIS — M47816 Spondylosis without myelopathy or radiculopathy, lumbar region: Secondary | ICD-10-CM | POA: Diagnosis not present

## 2021-05-21 DIAGNOSIS — M961 Postlaminectomy syndrome, not elsewhere classified: Secondary | ICD-10-CM | POA: Diagnosis not present

## 2021-05-21 DIAGNOSIS — M5136 Other intervertebral disc degeneration, lumbar region: Secondary | ICD-10-CM | POA: Diagnosis not present

## 2021-05-27 DIAGNOSIS — M533 Sacrococcygeal disorders, not elsewhere classified: Secondary | ICD-10-CM | POA: Diagnosis not present

## 2021-06-12 ENCOUNTER — Other Ambulatory Visit: Payer: Self-pay

## 2021-06-12 ENCOUNTER — Ambulatory Visit (HOSPITAL_COMMUNITY)
Admission: RE | Admit: 2021-06-12 | Discharge: 2021-06-12 | Disposition: A | Discharge status: Home / Self Care | Payer: Medicare Other | Source: Ambulatory Visit | Attending: Nurse Practitioner | Admitting: Nurse Practitioner

## 2021-06-12 ENCOUNTER — Encounter (HOSPITAL_COMMUNITY): Payer: Self-pay | Admitting: Nurse Practitioner

## 2021-06-12 VITALS — BP 142/90 | HR 74 | Ht 71.0 in | Wt 174.2 lb

## 2021-06-12 DIAGNOSIS — I4821 Permanent atrial fibrillation: Secondary | ICD-10-CM

## 2021-06-12 DIAGNOSIS — I10 Essential (primary) hypertension: Secondary | ICD-10-CM | POA: Insufficient documentation

## 2021-06-12 DIAGNOSIS — I4819 Other persistent atrial fibrillation: Secondary | ICD-10-CM | POA: Diagnosis present

## 2021-06-12 DIAGNOSIS — D6869 Other thrombophilia: Secondary | ICD-10-CM

## 2021-06-12 DIAGNOSIS — I3139 Other pericardial effusion (noninflammatory): Secondary | ICD-10-CM | POA: Insufficient documentation

## 2021-06-12 DIAGNOSIS — Z7901 Long term (current) use of anticoagulants: Secondary | ICD-10-CM | POA: Diagnosis not present

## 2021-06-12 LAB — BASIC METABOLIC PANEL
Anion gap: 7 (ref 5–15)
BUN: 24 mg/dL — ABNORMAL HIGH (ref 8–23)
CO2: 26 mmol/L (ref 22–32)
Calcium: 8.5 mg/dL — ABNORMAL LOW (ref 8.9–10.3)
Chloride: 104 mmol/L (ref 98–111)
Creatinine, Ser: 1.49 mg/dL — ABNORMAL HIGH (ref 0.61–1.24)
GFR, Estimated: 45 mL/min — ABNORMAL LOW (ref >60)
Glucose, Bld: 91 mg/dL (ref 70–99)
Potassium: 4.5 mmol/L (ref 3.5–5.1)
Sodium: 137 mmol/L (ref 135–145)

## 2021-06-12 LAB — CBC
HCT: 42.4 % (ref 39.0–52.0)
Hemoglobin: 13.5 g/dL (ref 13.0–17.0)
MCH: 31.8 pg (ref 26.0–34.0)
MCHC: 31.8 g/dL (ref 30.0–36.0)
MCV: 99.8 fL (ref 80.0–100.0)
Platelets: 151 10*3/uL (ref 150–400)
RBC: 4.25 MIL/uL (ref 4.22–5.81)
RDW: 13.5 % (ref 11.5–15.5)
WBC: 5.3 10*3/uL (ref 4.0–10.5)
nRBC: 0 % (ref 0.0–0.2)

## 2021-06-12 NOTE — Progress Notes (Signed)
Primary Care Physician: Harlan Stains Referring Physician: Harlan Stains, MD   Edgar Wilkerson is a 87 y.o. male with a history of asymptomatic persistent atrial fibrillation who presents for f/u in the [facility].  The patient was initially diagnosed with atrial fibrillation in 2014 and has been on anticoagulation with Eliquis since. He and his wife moved here from [facility] early in the year. He originally saw cardiology at [facility] but did not feel they had a good relationship and was referred to the AF clinic for management.  [place] 4-5 miles per day. He hopes to be able to play more golf when the weather improves.He has very little awareness of his atrial fibrillation. Continues on eliquis. Some bruising but no bleeding issues.   F/u in afib clinic 01/11/19. He reports that he is dong very well. Tolerating afib well. Very rarely will feel a flutter. Still very active riding his bike. Some bruising of his arms noted but no bleeding on the eliquis. He is very well rate controlled.  F/u in afib clinic, 04/12/20. He remains in rate controlled afib. He has no complaints today. Remains active. Labs reviewed from  his PCP from June. Creatinine at 1.2 so he will stay on 5 mg eliquis bid.    F/u in afib clinic, I have not seen pt since 2021. He has established with Dr. Irish Lack as he was found to have a large posterior pericardial effusion which is unchanged by latest echo 04/24/21. It will be rechecked in 3 months. He continues to be asymptomatic from his permanent rate controlled  afib. No issues with eliquis.   Today, he denies symptoms of palpitations, chest pain, shortness of breath, orthopnea, PND, lower extremity edema, dizziness, presyncope, syncope, snoring, daytime somnolence, bleeding, or neurologic sequela. The patient is tolerating medications without difficulties and is otherwise without  complaint today.    Atrial Fibrillation Risk Factors:  he does not have symptoms or diagnosis of sleep apnea.  he does not have a history of rheumatic fever.  he does not have a history of alcohol use.  he has a BMI of Body mass index is 24.3 kg/m.Marland Kitchen Filed Weights   06/12/21 1104  Weight: 79 kg     Atrial Fibrillation Management history:  Previous antiarrhythmic drugs: none  Previous cardioversions: none  Previous ablations: none  CHADS2VASC score: 4  Anticoagulation history: Eliquis   Past Medical History:  Diagnosis Date   BPH (benign prostatic hyperplasia)    Carotid artery stenosis    a. s/p R CEA   Carpal tunnel syndrome    Hypertension    Persistent atrial fibrillation (HCC)    Spinal stenosis    Past Surgical History:  Procedure Laterality Date   BACK SURGERY Bilateral    x 3    CAROTID ARTERY - SUBCLAVIAN ARTERY BYPASS GRAFT     CARPAL TUNNEL RELEASE     CATARACT EXTRACTION     IR FLUORO GUIDED NEEDLE PLC ASPIRATION/INJECTION LOC  04/03/2021   ROTATOR CUFF REPAIR      Current Outpatient Medications  Medication Sig Dispense Refill   acetaminophen (TYLENOL) 500 MG tablet Take 1,000 mg by mouth every 6 (six) hours as needed for moderate pain or headache.     alum & mag hydroxide-simeth (MAALOX/MYLANTA) 200-200-20 MG/5ML suspension Take 15 mLs by mouth every 6 (six) hours as needed for indigestion or heartburn.  apixaban (ELIQUIS) 5 MG TABS tablet Take 1 tablet by mouth 2 (two) times daily.     buPROPion (WELLBUTRIN XL) 150 MG 24 hr tablet Take 150 mg by mouth daily.     carvedilol (COREG) 25 MG tablet Take 25 mg by mouth 2 (two) times daily with a meal.     colchicine 0.6 MG tablet Take 0.6-1.2 mg by mouth See admin instructions. Take 1.2 mg as needed at the first onset of gout, then take 0.6 mg daily until gout flare subsides     furosemide (LASIX) 40 MG tablet      hydrOXYzine (ATARAX/VISTARIL) 25 MG tablet Take 25 mg by mouth 2 (two) times daily  as needed for itching.     lisinopril (ZESTRIL) 10 MG tablet Take 20 mg by mouth 2 (two) times daily.     rosuvastatin (CRESTOR) 20 MG tablet Take 1 tablet (20 mg total) by mouth daily. 90 tablet 3   tamsulosin (FLOMAX) 0.4 MG CAPS capsule Take 0.4 mg by mouth daily.     triamcinolone cream (KENALOG) 0.5 % Apply topically.     No current facility-administered medications for this encounter.    No Known Allergies  Social History   Socioeconomic History   Marital status: Married    Spouse name: Not on file   Number of children: Not on file   Years of education: Not on file   Highest education level: Not on file  Occupational History   Not on file  Tobacco Use   Smoking status: Former   Smokeless tobacco: Never   Tobacco comments:    Former smoker 06/12/2021  Substance and Sexual Activity   Alcohol use: Yes    Alcohol/week: 14.0 standard drinks    Types: 14 Glasses of wine per week    Comment: 1.5 glass of wine daily 06/12/2021   Drug use: Never   Sexual activity: Not on file  Other Topics Concern   Not on file  Social History Narrative   Not on file   Social Determinants of Health   Financial Resource Strain: Not on file  Food Insecurity: Not on file  Transportation Needs: Not on file  Physical Activity: Not on file  Stress: Not on file  Social Connections: Not on file  Intimate Partner Violence: Not on file    Family History  Problem Relation Age of Onset   Heart disease Father    Heart disease Brother    The patient does not have a history of early familial atrial fibrillation or other arrhythmias.  ROS- All systems are reviewed and negative except as per the HPI above.  Physical Exam: Vitals:   06/12/21 1104  BP: (!) 142/90  Pulse: 74  Weight: 79 kg  Height: 5\' 11"  (1.803 m)    GEN- The patient is well appearing, alert and oriented x 3 today.   Head- normocephalic, atraumatic Eyes-  Sclera clear, conjunctiva pink Ears- hearing intact Oropharynx-  clear Neck- supple  Lungs- Clear to ausculation bilaterally, normal work of breathing Heart- irregular rate and rhythm, no murmurs, rubs or gallops  GI- soft, NT, ND, + BS Extremities- no clubbing, cyanosis, or edema MS- no significant deformity or atrophy Skin- no rash or lesion Psych- euthymic mood, full affect Neuro- strength and sensation are intact  Wt Readings from Last 3 Encounters:  06/12/21 79 kg  04/03/21 79.4 kg  03/25/21 81.5 kg    EKG today demonstrates atrial fibrillation with v rate at 74 bpm,  qrs int  70  ms, qtc 397  ms   Epic records are reviewed at length today  Echo- 03/21/21- Study Conclusions   1. Left ventricular ejection fraction, by estimation, is 60 to 65%. Left  ventricular ejection fraction by 3D volume is 63 %. The left ventricle has  normal function. The left ventricle has no regional wall motion  abnormalities. There is mild concentric  left ventricular hypertrophy. Left ventricular diastolic function could  not be evaluated.   2. Right ventricular systolic function is normal. The right ventricular  size is normal. There is normal pulmonary artery systolic pressure. The  estimated right ventricular systolic pressure is 48.1 mmHg.   3. Large pericardial effusion. The pericardial effusion is  circumferential. There is no evidence of cardiac tamponade.   4. The mitral valve is normal in structure. Trivial mitral valve  regurgitation. No evidence of mitral stenosis.   5. The aortic valve is tricuspid. Aortic valve regurgitation is not  visualized. No aortic stenosis is present.   6. The inferior vena cava is dilated in size with >50% respiratory  variability, suggesting right atrial pressure of 8 mmHg.   Limited echo- 05/01/21-1. Left ventricular ejection fraction, by estimation, is 55 to 60%. The  left ventricle has normal function. The left ventricle has no regional  wall motion abnormalities. There is mild concentric left ventricular   hypertrophy. Left ventricular diastolic  function could not be evaluated.   2. Right ventricular systolic function is normal. The right ventricular  size is normal. There is mildly elevated pulmonary artery systolic  pressure. The estimated right ventricular systolic pressure is 85.9 mmHg.   3. Left atrial size was mildly dilated.   4. The effusion is greatest posterior to the LV free wall measuring  3.35cm at greatest diameter.. Large pericardial effusion. The pericardial  effusion is circumferential. There is no evidence of cardiac tamponade.   5. The mitral valve is normal in structure. Mild mitral valve  regurgitation. No evidence of mitral stenosis.   6. The aortic valve is normal in structure. Aortic valve regurgitation is  not visualized. No aortic stenosis is present.   7. The inferior vena cava is normal in size with greater than 50%  respiratory variability, suggesting right atrial pressure of 3 mmHg.   8. Comparison of side by side images from study dated 03/21/2021, shows  similar size pericardial effusion to slightly larger with greatest  diameter 3.35cm posterior to the LV free wall with no RV diastolic  collapse or RA inversion. No MV or TV inflow  with respirometer was done. The IVC is not dilated but collapses < 50%  with respiration adn PASP has increased from 47mmHg to 43mmHg.   Assessment and Plan:  1. Persistent atrial fibrillation The patient has asymptomatic permanent  atrial fibrillation.  He is appropriately anticoagulated with Eliquis at  5 mg bid    As he is asymptomatic and has likely been in AF for 5+ years, rate control is his long term approach   2.  HTN Stable   3. Pericardial effusion It is stable and is being followed closely by Dr. Irish Lack   Follow up with Dr. Irish Lack as scheduled Afib clinic as needed    Geroge Baseman. Kambra Beachem, [facility] [address] [place], [place] [address] [phone]

## 2021-06-13 ENCOUNTER — Other Ambulatory Visit (HOSPITAL_COMMUNITY): Payer: Self-pay | Admitting: *Deleted

## 2021-06-13 DIAGNOSIS — I4821 Permanent atrial fibrillation: Secondary | ICD-10-CM

## 2021-06-13 NOTE — Progress Notes (Signed)
error 

## 2021-06-26 ENCOUNTER — Other Ambulatory Visit: Payer: Medicare Other

## 2021-07-03 ENCOUNTER — Other Ambulatory Visit: Payer: Self-pay

## 2021-07-03 ENCOUNTER — Other Ambulatory Visit: Payer: Self-pay | Admitting: *Deleted

## 2021-07-03 ENCOUNTER — Other Ambulatory Visit: Payer: Medicare Other | Admitting: *Deleted

## 2021-07-03 DIAGNOSIS — E785 Hyperlipidemia, unspecified: Secondary | ICD-10-CM

## 2021-07-03 DIAGNOSIS — I4821 Permanent atrial fibrillation: Secondary | ICD-10-CM

## 2021-07-04 LAB — LIPID PANEL
Chol/HDL Ratio: 2.2 ratio (ref 0.0–5.0)
Cholesterol, Total: 119 mg/dL (ref 100–199)
HDL: 55 mg/dL (ref >39)
LDL Chol Calc (NIH): 54 mg/dL (ref 0–99)
Triglycerides: 39 mg/dL (ref 0–149)
VLDL Cholesterol Cal: 10 mg/dL (ref 5–40)

## 2021-07-04 LAB — HEPATIC FUNCTION PANEL
ALT: 23 IU/L (ref 0–44)
AST: 22 IU/L (ref 0–40)
Albumin: 3.8 g/dL (ref 3.6–4.6)
Alkaline Phosphatase: 77 IU/L (ref 44–121)
Bilirubin Total: 0.5 mg/dL (ref 0.0–1.2)
Bilirubin, Direct: 0.19 mg/dL (ref 0.00–0.40)
Total Protein: 6.1 g/dL (ref 6.0–8.5)

## 2021-07-04 LAB — BASIC METABOLIC PANEL
BUN/Creatinine Ratio: 18 (ref 10–24)
BUN: 21 mg/dL (ref 8–27)
CO2: 25 mmol/L (ref 20–29)
Calcium: 9.1 mg/dL (ref 8.6–10.2)
Chloride: 102 mmol/L (ref 96–106)
Creatinine, Ser: 1.2 mg/dL (ref 0.76–1.27)
Glucose: 90 mg/dL (ref 70–99)
Potassium: 4.6 mmol/L (ref 3.5–5.2)
Sodium: 138 mmol/L (ref 134–144)
eGFR: 58 mL/min/{1.73_m2} — ABNORMAL LOW (ref >59)

## 2021-08-07 ENCOUNTER — Ambulatory Visit (HOSPITAL_COMMUNITY): Payer: Medicare Other | Attending: Interventional Cardiology

## 2021-08-07 ENCOUNTER — Other Ambulatory Visit: Payer: Self-pay

## 2021-08-07 DIAGNOSIS — I3139 Other pericardial effusion (noninflammatory): Secondary | ICD-10-CM

## 2021-08-07 LAB — ECHOCARDIOGRAM LIMITED
Area-P 1/2: 4.6 cm2
S' Lateral: 2.7 cm

## 2021-08-12 ENCOUNTER — Telehealth: Payer: Self-pay | Admitting: Interventional Cardiology

## 2021-08-12 NOTE — Telephone Encounter (Signed)
I spoke with patient and reviewed echo results with him 

## 2021-08-12 NOTE — Telephone Encounter (Signed)
Patient returning call.

## 2021-09-22 NOTE — Progress Notes (Signed)
?  ?Cardiology Office Note ? ? ?Date:  09/23/2021  ? ?ID:  Edgar Wilkerson, DOB 1929-10-31, MRN [id number] ? ?PCP:  Edgar Halsted, MD  ? ? ?Chief Complaint  ?Patient presents with  ? Follow-up  ? ? ? ?Wt Readings from Last 3 Encounters:  ?09/23/21 177 lb (80.3 kg)  ?06/12/21 174 lb 3.2 oz (79 kg)  ?04/03/21 175 lb (79.4 kg)  ?  ? ?  ?History of Present Illness: ?Edgar Wilkerson is a 88 y.o. male   has chronic shoulder problems which required an injection. ?  ?  He is followed in the atrial fibrillation clinic and has been for several years.  His 2021 visit revealed: "a history of asymptomatic persistent atrial fibrillation who presents for f/u in the [facility].  The patient was initially diagnosed with atrial fibrillation in 2014 and has been on anticoagulation with Eliquis since. He and his wife moved here from [facility] early in the year. He originally saw cardiology at [facility] but did not feel they had a good relationship and was referred to the AF clinic for management.  [place] 4-5 miles per day. He hopes to be able to play more golf when the weather improves.He has very little awareness of his atrial fibrillation. Continues on eliquis. Some bruising but no bleeding issues. " ?  ?Pericardial effusion was noted on a PET scan.  Echocardiogram followed showing large pericardial effusion but no tamponade. ?"Left ventricular ejection fraction, by estimation, is 60 to 65%. Left  ?ventricular ejection fraction by 3D volume is 63 %. The left ventricle has  ?normal function. The left ventricle has no regional wall motion  ?abnormalities. There is mild concentric  ?left ventricular hypertrophy. Left ventricular diastolic function could  ?not be evaluated.  ? 2. Right ventricular systolic function is normal. The right ventricular  ?size is normal. There is normal pulmonary artery systolic pressure. The  ?estimated right  ventricular systolic pressure is 78.5 mmHg.  ? 3. Large pericardial effusion. The pericardial effusion is  ?circumferential. There is no evidence of cardiac tamponade.  ? 4. The mitral valve is normal in structure. Trivial mitral valve  ?regurgitation. No evidence of mitral stenosis.  ? 5. The aortic valve is tricuspid. Aortic valve regurgitation is not  ?visualized. No aortic stenosis is present.  ? 6. The inferior vena cava is dilated in size with >50% respiratory  ?variability, suggesting right atrial pressure of 8 mmHg. " ?  ?Of note, PET scan showed some findings in the spine concerning for metastatic disease, potentially linked to prostate cancer.  PSA was recommended.  He needs preoperative clearance for T8 biopsy. ?  ?In July 2022, he had cellulitis after a derm procedure.  He was treated with antibiotics, but continued to have LE swelling.  Lasix was started for 5 days.  In 02/2021, he was still retaining fluid.  ?  ?He remains active, still walks.  Golf for a long time, but has been limited by shoulder and back pain. ? ?Repeat echo in 07/2021 showed decrease in size of pericardial effusion. ? ?Can't walk and play golf as much anymore.  Pain limits some activity. ? ?Does some lightweights and floor exercises. ? ?Denies : Chest pain. Dizziness. Leg edema. Nitroglycerin use. Orthopnea.  Paroxysmal nocturnal dyspnea. Shortness of breath. Syncope.   ? ?No bleeding problems.  ? ? ? ?Past Medical History:  ?Diagnosis Date  ?  BPH (benign prostatic hyperplasia)   ? Carotid artery stenosis   ? a. s/p R CEA  ? Carpal tunnel syndrome   ? Hypertension   ? Persistent atrial fibrillation (Edgar Wilkerson)   ? Spinal stenosis   ? ? ?Past Surgical History:  ?Procedure Laterality Date  ? BACK SURGERY Bilateral   ? x 3   ? CAROTID ARTERY - SUBCLAVIAN ARTERY BYPASS GRAFT    ? CARPAL TUNNEL RELEASE    ? CATARACT EXTRACTION    ? IR FLUORO GUIDED NEEDLE PLC ASPIRATION/INJECTION LOC  04/03/2021  ? ROTATOR CUFF REPAIR    ? ? ? ?Current  Outpatient Medications  ?Medication Sig Dispense Refill  ? acetaminophen (TYLENOL) 500 MG tablet Take 1,000 mg by mouth every 6 (six) hours as needed for moderate pain or headache.    ? alum & mag hydroxide-simeth (MAALOX/MYLANTA) 200-200-20 MG/5ML suspension Take 15 mLs by mouth every 6 (six) hours as needed for indigestion or heartburn.    ? apixaban (ELIQUIS) 5 MG TABS tablet Take 1 tablet by mouth 2 (two) times daily.    ? buPROPion (WELLBUTRIN XL) 150 MG 24 hr tablet Take 150 mg by mouth daily.    ? carvedilol (COREG) 25 MG tablet Take 25 mg by mouth 2 (two) times daily with a meal.    ? colchicine 0.6 MG tablet Take 0.6-1.2 mg by mouth See admin instructions. Take 1.2 mg as needed at the first onset of gout, then take 0.6 mg daily until gout flare subsides    ? furosemide (LASIX) 40 MG tablet Take 40 mg by mouth daily as needed.    ? HYDROcodone-acetaminophen (NORCO/VICODIN) 5-325 MG tablet take 1-2  tablet by oral route  every 6 hours as needed for pain    ? hydrOXYzine (ATARAX/VISTARIL) 25 MG tablet Take 25 mg by mouth 2 (two) times daily as needed for itching.    ? lisinopril (ZESTRIL) 20 MG tablet Take 20 mg by mouth 2 (two) times daily.    ? rosuvastatin (CRESTOR) 20 MG tablet Take 1 tablet (20 mg total) by mouth daily. 90 tablet 3  ? tamsulosin (FLOMAX) 0.4 MG CAPS capsule Take 0.4 mg by mouth daily.    ? triamcinolone cream (KENALOG) 0.5 % Apply topically.    ? ?No current facility-administered medications for this visit.  ? ? ?Allergies:   Patient has no known allergies.  ? ? ?Social History:  The patient  reports that he has quit smoking. He has never used smokeless tobacco. He reports current alcohol use of about 14.0 standard drinks per week. He reports that he does not use drugs.  ? ?Family History:  The patient's family history includes Heart disease in his brother and father.  ? ? ?ROS:  Please see the history of present illness.   Otherwise, review of systems are positive for back pain, chronic  shoulder pain.   All other systems are reviewed and negative.  ? ? ?PHYSICAL EXAM: ?VS:  BP 120/70   Pulse 65   Ht '5\' 10"'$  (1.778 m)   Wt 177 lb (80.3 kg)   SpO2 98%   BMI 25.40 kg/m?  , BMI Body mass index is 25.4 kg/m?. ?GEN: Well nourished, well developed, in no acute distress ?HEENT: normal ?Neck: no JVD, carotid bruits, or masses ?Cardiac: Irregularly irregular, rate controlled; no murmurs, rubs, or gallops,tr ankle edema  ?Respiratory:  clear to auscultation bilaterally, normal work of breathing ?GI: soft, nontender, nondistended, + BS ?MS: no deformity or atrophy ?Skin:  warm and dry, no rash ?Neuro:  Strength and sensation are intact ?Psych: euthymic mood, full affect ? ? ?EKG:   ?The ekg ordered Feb 2023 demonstrates atrial fibrillation, rate controlled ? ? ?Recent Labs: ?06/12/2021: Hemoglobin 13.5; Platelets 151 ?07/03/2021: ALT 23; BUN 21; Creatinine, Ser 1.20; Potassium 4.6; Sodium 138  ? ?Lipid Panel ?   ?Component Value Date/Time  ? CHOL 119 07/03/2021 1132  ? TRIG 39 07/03/2021 1132  ? HDL 55 07/03/2021 1132  ? CHOLHDL 2.2 07/03/2021 1132  ? LDLCALC 54 07/03/2021 1132  ? ?  ?Other studies Reviewed: ?Additional studies/ records that were reviewed today with results demonstrating: Echo result reviewed-decrease in size of pericardial effusion. ? ? ?ASSESSMENT AND PLAN: ? ?AFib: Rate controlled. Eliquis for stroke prevention.  ?Aortic atherosclerosis/coronary artery calcification: No angina.  Continue aggressive secondary prevention. ?Pericardial effusion: Decreased in size from prior echo.  No evidence of heart failure on exam.  Recheck limited echo in September 2023. ?Anticoagulated: No significant bleeding issues.  Hemoglobin 13.5 in December 2022.  We discussed the fact that his per day risk of stroke is low despite atrial fibrillation.  If he needed to hold Eliquis for a procedure on his back, we could arrange for this. ?Avoiding falls and staying active are his two biggest priorities.  ? ?This  patients CHA2DS2-VASc Score and unadjusted Ischemic Stroke Rate (% per year) is equal to ?4.8 % stroke rate/year from a score of 4 ? ?Above score calculated as 1 point each if present [CHF, HTN, DM, Vascular=MI/PAD/Aortic Pla

## 2021-09-23 ENCOUNTER — Other Ambulatory Visit: Payer: Self-pay

## 2021-09-23 ENCOUNTER — Encounter: Payer: Self-pay | Admitting: Interventional Cardiology

## 2021-09-23 ENCOUNTER — Ambulatory Visit (INDEPENDENT_AMBULATORY_CARE_PROVIDER_SITE_OTHER): Payer: Medicare Other | Admitting: Interventional Cardiology

## 2021-09-23 VITALS — BP 120/70 | HR 65 | Ht 70.0 in | Wt 177.0 lb

## 2021-09-23 DIAGNOSIS — I4821 Permanent atrial fibrillation: Secondary | ICD-10-CM

## 2021-09-23 DIAGNOSIS — Z7901 Long term (current) use of anticoagulants: Secondary | ICD-10-CM | POA: Diagnosis not present

## 2021-09-23 DIAGNOSIS — D6869 Other thrombophilia: Secondary | ICD-10-CM

## 2021-09-23 DIAGNOSIS — I3139 Other pericardial effusion (noninflammatory): Secondary | ICD-10-CM

## 2021-09-23 DIAGNOSIS — I700 Atherosclerosis of aorta: Secondary | ICD-10-CM

## 2021-09-23 NOTE — Patient Instructions (Signed)
Medication Instructions:  ?Your physician recommends that you continue on your current medications as directed. Please refer to the Current Medication list given to you today. ? ?*If you need a refill on your cardiac medications before your next appointment, please call your pharmacy* ? ? ?Lab Work: ?none ?If you have labs (blood work) drawn today and your tests are completely normal, you will receive your results only by: ?MyChart Message (if you have MyChart) OR ?A paper copy in the mail ?If you have any lab test that is abnormal or we need to change your treatment, we will call you to review the results. ? ? ?Testing/Procedures: ?Your physician has requested that you have a limited  echocardiogram. Echocardiography is a painless test that uses sound waves to create images of your heart. It provides your doctor with information about the size and shape of your heart and how well your heart?s chambers and valves are working. This procedure takes approximately one hour. There are no restrictions for this procedure.  ?To be done in 6 months on day of appointment with Dr Irish Lack (prior to appointment) ? ?Follow-Up: ?At [facility], you and your health needs are our priority.  As part of our continuing mission to provide you with exceptional heart care, we have created designated Provider Care Teams.  These Care Teams include your primary Cardiologist (physician) and Advanced Practice Providers (APPs -  Physician Assistants and Nurse Practitioners) who all work together to provide you with the care you need, when you need it. ? ?We recommend signing up for the patient portal called "MyChart".  Sign up information is provided on this After Visit Summary.  MyChart is used to connect with patients for Virtual Visits (Telemedicine).  Patients are able to view lab/test results, encounter notes, upcoming appointments, etc.  Non-urgent messages can be sent to your provider as well.   ?To learn more about what you can do  with MyChart, go to [email or web address].   ? ?Your next appointment:   ?6 month(s) ? ?The format for your next appointment:   ?In Person ? ?Provider:   ?Larae Grooms, MD   ? ? ?Other Instructions ?  ? ?

## 2022-03-26 ENCOUNTER — Other Ambulatory Visit: Payer: Self-pay | Admitting: Interventional Cardiology

## 2022-03-27 NOTE — Progress Notes (Signed)
Cardiology Office Note   Date:  03/28/2022   ID:  Edgar Wilkerson, DOB 05/12/1930, MRN [id number]  PCP:  Marilynne Halsted, MD    No chief complaint on file.  AFib  Wt Readings from Last 3 Encounters:  03/28/22 177 lb (80.3 kg)  09/23/21 177 lb (80.3 kg)  06/12/21 174 lb 3.2 oz (79 kg)       History of Present Illness: Edgar Wilkerson is a 88 y.o. male   has chronic shoulder problems which required an injection.     He is followed in the atrial fibrillation clinic and has been for several years.  His 2021 visit revealed: "a history of asymptomatic persistent atrial fibrillation who [facility].  The patient was initially diagnosed with atrial fibrillation in 2014 and has been on anticoagulation with Eliquis since. He and his wife moved here from [facility] early in the year. He originally saw cardiology at [facility] but did not feel they had a good relationship and was referred to the AF clinic for management.  [place] 4-5 miles per day. He hopes to be able to play more golf when the weather improves.He has very little awareness of his atrial fibrillation. Continues on eliquis. Some bruising but no bleeding issues. "   Pericardial effusion was noted on a PET scan.  Echocardiogram followed showing large pericardial effusion but no tamponade. "Left ventricular ejection fraction, by estimation, is 60 to 65%. Left  ventricular ejection fraction by 3D volume is 63 %. The left ventricle has  normal function. The left ventricle has no regional wall motion  abnormalities. There is mild concentric  left ventricular hypertrophy. Left ventricular diastolic function could  not be evaluated.   2. Right ventricular systolic function is normal. The right ventricular  size is normal. There is normal pulmonary artery systolic pressure. The  estimated right ventricular systolic pressure  is 37.6 mmHg.   3. Large pericardial effusion. The pericardial effusion is  circumferential. There is no evidence of cardiac tamponade.   4. The mitral valve is normal in structure. Trivial mitral valve  regurgitation. No evidence of mitral stenosis.   5. The aortic valve is tricuspid. Aortic valve regurgitation is not  visualized. No aortic stenosis is present.   6. The inferior vena cava is dilated in size with >50% respiratory  variability, suggesting right atrial pressure of 8 mmHg. "   Of note, PET scan showed some findings in the spine concerning for metastatic disease, potentially linked to prostate cancer.  PSA was recommended.  He needs preoperative clearance for T8 biopsy.   In July 2022, he had cellulitis after a derm procedure.  He was treated with antibiotics, but continued to have LE swelling.  Lasix was started for 5 days.  In 02/2021, he was still retaining fluid.    He remains active, still walks.  Golf for a long time, but has been limited by shoulder and back pain.   Repeat echo in 07/2021 showed decrease in size of pericardial effusion.   Can't walk and play golf as much anymore.  Pain limits some activity.   Does some lightweights and floor exercises.- decreased in frequency.   Denies : Chest pain. Dizziness. Leg edema. Nitroglycerin use. Orthopnea. Palpitations. Paroxysmal nocturnal dyspnea. Shortness of breath. Syncope.    Easy bruising.     Past Medical History:  Diagnosis  Date   BPH (benign prostatic hyperplasia)    Carotid artery stenosis    a. s/p R CEA   Carpal tunnel syndrome    Hypertension    Persistent atrial fibrillation (HCC)    Spinal stenosis     Past Surgical History:  Procedure Laterality Date   BACK SURGERY Bilateral    x 3    CAROTID ARTERY - SUBCLAVIAN ARTERY BYPASS GRAFT     CARPAL TUNNEL RELEASE     CATARACT EXTRACTION     IR FLUORO GUIDED NEEDLE PLC ASPIRATION/INJECTION LOC  04/03/2021   ROTATOR CUFF REPAIR       Current  Outpatient Medications  Medication Sig Dispense Refill   acetaminophen (TYLENOL) 500 MG tablet Take 1,000 mg by mouth every 6 (six) hours as needed for moderate pain or headache.     alum & mag hydroxide-simeth (MAALOX/MYLANTA) 200-200-20 MG/5ML suspension Take 15 mLs by mouth every 6 (six) hours as needed for indigestion or heartburn.     apixaban (ELIQUIS) 5 MG TABS tablet Take 1 tablet by mouth 2 (two) times daily.     buPROPion (WELLBUTRIN XL) 150 MG 24 hr tablet Take 150 mg by mouth daily.     carvedilol (COREG) 25 MG tablet Take 25 mg by mouth 2 (two) times daily with a meal.     colchicine 0.6 MG tablet Take 0.6-1.2 mg by mouth See admin instructions. Take 1.2 mg as needed at the first onset of gout, then take 0.6 mg daily until gout flare subsides     erythromycin ophthalmic ointment Place into both eyes as needed.     furosemide (LASIX) 40 MG tablet Take 40 mg by mouth daily as needed.     HYDROcodone-acetaminophen (NORCO/VICODIN) 5-325 MG tablet take 1-2  tablet by oral route  every 6 hours as needed for pain     hydrOXYzine (ATARAX/VISTARIL) 25 MG tablet Take 25 mg by mouth 2 (two) times daily as needed for itching.     indomethacin (INDOCIN) 50 MG capsule Take 50 mg by mouth as needed.     lisinopril (ZESTRIL) 20 MG tablet Take 20 mg by mouth 2 (two) times daily.     rosuvastatin (CRESTOR) 20 MG tablet TAKE ONE TABLET BY MOUTH DAILY 90 tablet 1   sildenafil (VIAGRA) 25 MG tablet Take 25 mg by mouth as needed.     tamsulosin (FLOMAX) 0.4 MG CAPS capsule Take 0.4 mg by mouth daily.     triamcinolone cream (KENALOG) 0.1 % as needed.     triamcinolone cream (KENALOG) 0.5 % Apply topically.     No current facility-administered medications for this visit.    Allergies:   Patient has no known allergies.    Social History:  The patient  reports that he has quit smoking. He has never used smokeless tobacco. He reports current alcohol use of about 14.0 standard drinks of alcohol per week.  He reports that he does not use drugs.   Family History:  The patient's family history includes Heart disease in his brother and father.    ROS:  Please see the history of present illness.   Otherwise, review of systems are positive for back pain.   All other systems are reviewed and negative.    PHYSICAL EXAM: VS:  BP (!) 158/84 (BP Location: Left Arm, Patient Position: Sitting, Cuff Size: Normal)   Pulse (!) 51   Ht '5\' 10"'$  (1.778 m)   Wt 177 lb (80.3 kg)   SpO2 97%  BMI 25.40 kg/m  , BMI Body mass index is 25.4 kg/m. GEN: Well nourished, well developed, in no acute distress HEENT: normal Neck: no JVD, carotid bruits, or masses Cardiac: Irregularly irregular; no murmurs, rubs, or gallops,no edema  Respiratory:  clear to auscultation bilaterally, normal work of breathing GI: soft, nontender, nondistended, + BS MS: no deformity or atrophy Skin: warm and dry, no rash Neuro:  Strength and sensation are intact Psych: euthymic mood, full affect   EKG:   The ekg ordered today demonstrates atrial fibrillation, slow ventricular response   Recent Labs: 06/12/2021: Hemoglobin 13.5; Platelets 151 07/03/2021: ALT 23; BUN 21; Creatinine, Ser 1.20; Potassium 4.6; Sodium 138   Lipid Panel    Component Value Date/Time   CHOL 119 07/03/2021 1132   TRIG 39 07/03/2021 1132   HDL 55 07/03/2021 1132   CHOLHDL 2.2 07/03/2021 1132   LDLCALC 54 07/03/2021 1132     Other studies Reviewed: Additional studies/ records that were reviewed today with results demonstrating: January 2023 cholesterol 119 HDL 55 LDL 54 triglycerides 39.   ASSESSMENT AND PLAN:  AFib: Slow ventricular response noted on ECG.  Heart rate was increased when I examined him.  No symptoms suggestive of bradycardia. Aortic atherosclerosis/coronary artery calcification: Pericardial effusion: I reviewed today's ECG compared to the prior one from March.  It does appear that the fluid is slightly more prominent.  It is still  mostly behind his heart. Anticoagulated: Continue Eliquis for now to reduce stroke risk.  No significant bleeding issues. HTN: Controlled at home. Readings in the 824M systolic.  Repeat blood pressure in the office today much improved.   Current medicines are reviewed at length with the patient today.  The patient concerns regarding his medicines were addressed.  The following changes have been made:  No change  Labs/ tests ordered today include: Check limited echo in spring into summer 2024. No orders of the defined types were placed in this encounter.   Recommend 150 minutes/week of aerobic exercise Low fat, low carb, high fiber diet recommended  Disposition:   FU in 9 months   Signed, Larae Grooms, MD  03/28/2022 11:35 AM    [address], [place], [place]  [address] Phone: [phone]; Fax: [phone]

## 2022-03-28 ENCOUNTER — Ambulatory Visit (HOSPITAL_BASED_OUTPATIENT_CLINIC_OR_DEPARTMENT_OTHER): Payer: Medicare Other

## 2022-03-28 ENCOUNTER — Encounter: Payer: Self-pay | Admitting: Interventional Cardiology

## 2022-03-28 ENCOUNTER — Ambulatory Visit (HOSPITAL_COMMUNITY): Payer: Medicare Other | Attending: Interventional Cardiology | Admitting: Interventional Cardiology

## 2022-03-28 VITALS — BP 128/72 | HR 51 | Ht 70.0 in | Wt 177.0 lb

## 2022-03-28 DIAGNOSIS — Z7901 Long term (current) use of anticoagulants: Secondary | ICD-10-CM | POA: Diagnosis present

## 2022-03-28 DIAGNOSIS — I3139 Other pericardial effusion (noninflammatory): Secondary | ICD-10-CM

## 2022-03-28 DIAGNOSIS — I358 Other nonrheumatic aortic valve disorders: Secondary | ICD-10-CM | POA: Diagnosis not present

## 2022-03-28 DIAGNOSIS — I700 Atherosclerosis of aorta: Secondary | ICD-10-CM | POA: Insufficient documentation

## 2022-03-28 DIAGNOSIS — I4821 Permanent atrial fibrillation: Secondary | ICD-10-CM | POA: Diagnosis present

## 2022-03-28 LAB — ECHOCARDIOGRAM LIMITED
Area-P 1/2: 4.13 cm2
S' Lateral: 2.4 cm

## 2022-03-28 NOTE — Patient Instructions (Signed)
Medication Instructions:  Your physician recommends that you continue on your current medications as directed. Please refer to the Current Medication list given to you today.  *If you need a refill on your cardiac medications before your next appointment, please call your pharmacy*   Lab Work: none If you have labs (blood work) drawn today and your tests are completely normal, you will receive your results only by: [place] (if you have MyChart) OR A paper copy in the mail If you have any lab test that is abnormal or we need to change your treatment, we will call you to review the results.   Testing/Procedures: Your physician has requested that you have an echocardiogram. Echocardiography is a painless test that uses sound waves to create images of your heart. It provides your doctor with information about the size and shape of your heart and how well your heart's chambers and valves are working. This procedure takes approximately one hour. There are no restrictions for this procedure. To be done in 7-9 months on day of appointment with Dr Irish Lack (prior to appointment)   Follow-Up: At [facility], you and your health needs are our priority.  As part of our continuing mission to provide you with exceptional heart care, we have created designated Provider Care Teams.  These Care Teams include your primary Cardiologist (physician) and Advanced Practice Providers (APPs -  Physician Assistants and Nurse Practitioners) who all work together to provide you with the care you need, when you need it.  We recommend signing up for the patient portal called "MyChart".  Sign up information is provided on this After Visit Summary.  MyChart is used to connect with patients for Virtual Visits (Telemedicine).  Patients are able to view lab/test results, encounter notes, upcoming appointments, etc.  Non-urgent messages can be sent to your provider as well.   To learn more about what you can do  with MyChart, go to [email or web address].    Your next appointment:   7-9 month(s)  The format for your next appointment:   In Person  Provider:   Larae Grooms, MD     Other Instructions    Important Information About Sugar

## 2022-08-07 ENCOUNTER — Encounter (HOSPITAL_COMMUNITY): Payer: Self-pay | Admitting: *Deleted

## 2022-09-18 ENCOUNTER — Other Ambulatory Visit: Payer: Self-pay | Admitting: Interventional Cardiology

## 2022-09-23 IMAGING — CT NM PET TUM IMG INITIAL (PI) SKULL BASE T - THIGH
1 of 7 series · 1 of 25 positions shown · non-contrast
Comparison: None
COMPARISON: None

Addendum:
CLINICAL DATA: Initial treatment strategy for reportedly for a
lesion of the spine discovered on an outside MRI from [REDACTED]
which is not available for review.

EXAM:
NUCLEAR MEDICINE PET SKULL BASE TO THIGH
TECHNIQUE: 8.97 mCi F-18 FDG was injected intravenously. Full-ring PET imaging
was performed from the skull base to thigh after the radiotracer. CT
data was obtained and used for attenuation correction and anatomic
localization.
Fasting blood glucose: 90 mg/dl

[Series 4: ct sk_thigh 5.0 bf37 · axial · 5.0mm · 0.98mm/px · 1 of 237 slices shown]
[im 237/237  brain]
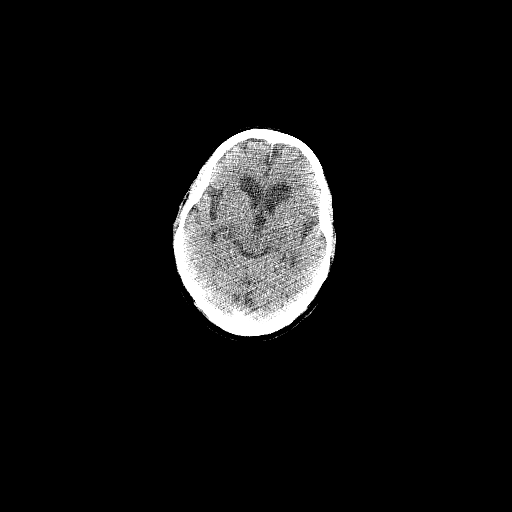

[1 of 25 positions shown; findings below may reference images not displayed]

FINDINGS: Mediastinal blood pool activity: SUV max

Liver activity: SUV max NA

NECK: No hypermetabolic lymph nodes in the neck.

Incidental CT findings: none

CHEST: No hypermetabolic mediastinal or hilar nodes. No suspicious
pulmonary nodules on the CT scan.

Incidental CT findings: Calcified atheromatous plaque in the
thoracic aorta. No aneurysmal dilation. Normal caliber of the
central pulmonary vessels. Moderately large size pericardial
effusion proximally 2 cm greatest thickness measuring less than 20
Hounsfield units, only slightly dense on the current exam. No priors
for comparison. Three-vessel coronary artery disease. Limited
assessment of cardiovascular structures given lack of intravenous
contrast. Lungs are clear. Airways are patent. No effusion or
consolidation. No acute bone findings about the bony thorax.

ABDOMEN/PELVIS: No abnormal hypermetabolic activity within the
liver, pancreas, adrenal glands, or spleen. No hypermetabolic lymph
nodes in the abdomen or pelvis.

Incidental CT findings: Cyst in the anterior LEFT hemi liver, medial
and lateral segment. No pericholecystic stranding. The no acute
finding related to pancreas, spleen, adrenal glands or kidneys
though there is some perinephric stranding seen bilaterally there is
no hydronephrosis. No nephrolithiasis. No acute gastrointestinal
findings. The appendix is normal. Colonic diverticulosis. Post
prostatectomy.

No signs of ascites.

SKELETON: Hypermetabolic features associated with the T8 abnormality
that is referenced on the outside MRI. Maximum SUV of 5.3. There is
very little sclerosis at this level. No additional evidence of
increased metabolic activity in the spine. Subtle focus of increased
metabolic activity about the RIGHT second rib (image 49/4) maximum
SUV 3.1. No definite underlying lesion in this location by CT.

Incidental CT findings: Spinal degenerative changes.
IMPRESSION: Hypermetabolic area corresponding to the abnormality described on
the previous MRI. Recommend comparison with the MRI, PSA correlation
given patient history of prostate cancer and with biopsy as
warranted. Findings are suspicious for metastatic disease without
visible CT correlate.

Subtle area of increased metabolic activity in the RIGHT lateral
second rib without corresponding abnormality on CT.

Pericardial effusion, moderately large. Correlate with any new chest
symptoms and consider echocardiography for further evaluation.
Density values nonspecific, less than 20 Hounsfield units more
suggestive of simple or nearly simple fluid and less than would be
expected for blood.

Three-vessel coronary artery.

Aortic Atherosclerosis (K0FTB-Q87.7).

ADDENDUM:
There is an error in the initial impression. The patient does not
have a history of prostate cancer. There are no changes of
prostatectomy in the pelvis. This was reported in error. Seminal
vesicles are visible. The prostate is small and there may be changes
of TURP.

Findings in the spine remain suspicious for a metastatic focus
without CT correlate. This is seen at the T8 level corresponding to
the reported abnormality on the outside MRI. Could consider PSA
correlation in a male patient though findings are nonspecific.

*** End of Addendum ***
FINDINGS: Mediastinal blood pool activity: SUV max

Liver activity: SUV max NA

NECK: No hypermetabolic lymph nodes in the neck.

Incidental CT findings: none

CHEST: No hypermetabolic mediastinal or hilar nodes. No suspicious
pulmonary nodules on the CT scan.

Incidental CT findings: Calcified atheromatous plaque in the
thoracic aorta. No aneurysmal dilation. Normal caliber of the
central pulmonary vessels. Moderately large size pericardial
effusion proximally 2 cm greatest thickness measuring less than 20
Hounsfield units, only slightly dense on the current exam. No priors
for comparison. Three-vessel coronary artery disease. Limited
assessment of cardiovascular structures given lack of intravenous
contrast. Lungs are clear. Airways are patent. No effusion or
consolidation. No acute bone findings about the bony thorax.

ABDOMEN/PELVIS: No abnormal hypermetabolic activity within the
liver, pancreas, adrenal glands, or spleen. No hypermetabolic lymph
nodes in the abdomen or pelvis.

Incidental CT findings: Cyst in the anterior LEFT hemi liver, medial
and lateral segment. No pericholecystic stranding. The no acute
finding related to pancreas, spleen, adrenal glands or kidneys
though there is some perinephric stranding seen bilaterally there is
no hydronephrosis. No nephrolithiasis. No acute gastrointestinal
findings. The appendix is normal. Colonic diverticulosis. Post
prostatectomy.

No signs of ascites.

SKELETON: Hypermetabolic features associated with the T8 abnormality
that is referenced on the outside MRI. Maximum SUV of 5.3. There is
very little sclerosis at this level. No additional evidence of
increased metabolic activity in the spine. Subtle focus of increased
metabolic activity about the RIGHT second rib (image 49/4) maximum
SUV 3.1. No definite underlying lesion in this location by CT.

Incidental CT findings: Spinal degenerative changes.
IMPRESSION: Hypermetabolic area corresponding to the abnormality described on
the previous MRI. Recommend comparison with the MRI, PSA correlation
given patient history of prostate cancer and with biopsy as
warranted. Findings are suspicious for metastatic disease without
visible CT correlate.

Subtle area of increased metabolic activity in the RIGHT lateral
second rib without corresponding abnormality on CT.

Pericardial effusion, moderately large. Correlate with any new chest
symptoms and consider echocardiography for further evaluation.
Density values nonspecific, less than 20 Hounsfield units more
suggestive of simple or nearly simple fluid and less than would be
expected for blood.

Three-vessel coronary artery.

Aortic Atherosclerosis (K0FTB-Q87.7).

## 2023-02-25 NOTE — Progress Notes (Unsigned)
Cardiology Office Note   Date:  02/26/2023   ID:  Edgar Wilkerson, DOB 29-Aug-1929, MRN [id number]  PCP:  Nettie Elm, MD    No chief complaint on file.  AFib  Wt Readings from Last 3 Encounters:  02/26/23 177 lb 12.8 oz (80.6 kg)  03/28/22 177 lb (80.3 kg)  09/23/21 177 lb (80.3 kg)       History of Present Illness: Edgar Wilkerson is a 89 y.o. male  has chronic shoulder problems which required an injection.     He is followed in the atrial fibrillation clinic and has been for several years.  His 2021 visit revealed: "a history of asymptomatic persistent atrial fibrillation who presents for f/u in the [facility].  The patient was initially diagnosed with atrial fibrillation in 2014 and has been on anticoagulation with Eliquis since. He and his wife moved here from [facility] early in the year. He originally saw cardiology at [facility] but did not feel they had a good relationship and was referred to the AF clinic for management.  [place] 4-5 miles per day. He hopes to be able to play more golf when the weather improves.He has very little awareness of his atrial fibrillation. Continues on eliquis. Some bruising but no bleeding issues. "   Pericardial effusion was noted on a PET scan.  Echocardiogram followed showing large pericardial effusion but no tamponade. "Left ventricular ejection fraction, by estimation, is 60 to 65%. Left  ventricular ejection fraction by 3D volume is 63 %. The left ventricle has  normal function. The left ventricle has no regional wall motion  abnormalities. There is mild concentric  left ventricular hypertrophy. Left ventricular diastolic function could  not be evaluated.   2. Right ventricular systolic function is normal. The right ventricular  size is normal. There is normal pulmonary artery systolic pressure. The  estimated right ventricular systolic  pressure is 31.6 mmHg.   3. Large pericardial effusion. The pericardial effusion is  circumferential. There is no evidence of cardiac tamponade.   4. The mitral valve is normal in structure. Trivial mitral valve  regurgitation. No evidence of mitral stenosis.   5. The aortic valve is tricuspid. Aortic valve regurgitation is not  visualized. No aortic stenosis is present.   6. The inferior vena cava is dilated in size with >50% respiratory  variability, suggesting right atrial pressure of 8 mmHg. "   Of note, PET scan showed some findings in the spine concerning for metastatic disease, potentially linked to prostate cancer.  PSA was recommended.  He needs preoperative clearance for T8 biopsy.   In July 2022, he had cellulitis after a derm procedure.  He was treated with antibiotics, but continued to have LE swelling.  Lasix was started for 5 days.  In 02/2021, he was still retaining fluid.    He remains active, still walks.  Golf for a long time, but has been limited by shoulder and back pain.   Repeat echo in 07/2021 showed decrease in size of pericardial effusion.   In 2023: "Can't walk and play golf as much anymore.  Pain limits some activity.  Does some lightweights and floor exercises.- decreased in frequency. "  Back pain has limited his activities.  He denies any bleeding issues.  No shortness of breath.  Denies : Chest pain. Dizziness. Leg edema. Nitroglycerin use. Orthopnea. Palpitations. Paroxysmal nocturnal dyspnea.  Shortness of breath. Syncope.      Past Medical History:  Diagnosis Date   BPH (benign prostatic hyperplasia)    Carotid artery stenosis    a. s/p R CEA   Carpal tunnel syndrome    Hypertension    Persistent atrial fibrillation (HCC)    Spinal stenosis     Past Surgical History:  Procedure Laterality Date   BACK SURGERY Bilateral    x 3    CAROTID ARTERY - SUBCLAVIAN ARTERY BYPASS GRAFT     CARPAL TUNNEL RELEASE     CATARACT EXTRACTION     IR FLUORO  GUIDED NEEDLE PLC ASPIRATION/INJECTION LOC  04/03/2021   ROTATOR CUFF REPAIR       Current Outpatient Medications  Medication Sig Dispense Refill   acetaminophen (TYLENOL) 500 MG tablet Take 1,000 mg by mouth every 6 (six) hours as needed for moderate pain or headache.     alum & mag hydroxide-simeth (MAALOX/MYLANTA) 200-200-20 MG/5ML suspension Take 15 mLs by mouth every 6 (six) hours as needed for indigestion or heartburn.     apixaban (ELIQUIS) 5 MG TABS tablet Take 1 tablet by mouth 2 (two) times daily.     buPROPion (WELLBUTRIN XL) 150 MG 24 hr tablet Take 150 mg by mouth daily.     carvedilol (COREG) 25 MG tablet Take 25 mg by mouth 2 (two) times daily with a meal.     colchicine 0.6 MG tablet Take 0.6-1.2 mg by mouth See admin instructions. Take 1.2 mg as needed at the first onset of gout, then take 0.6 mg daily until gout flare subsides     erythromycin ophthalmic ointment Place into both eyes as needed.     furosemide (LASIX) 40 MG tablet Take 40 mg by mouth daily as needed.     HYDROcodone-acetaminophen (NORCO/VICODIN) 5-325 MG tablet take 1-2  tablet by oral route  every 6 hours as needed for pain     hydrOXYzine (ATARAX/VISTARIL) 25 MG tablet Take 25 mg by mouth 2 (two) times daily as needed for itching.     lisinopril (ZESTRIL) 20 MG tablet Take 20 mg by mouth 2 (two) times daily.     rosuvastatin (CRESTOR) 20 MG tablet TAKE 1 TABLET BY MOUTH DAILY 90 tablet 1   sildenafil (VIAGRA) 25 MG tablet Take 25 mg by mouth as needed.     tamsulosin (FLOMAX) 0.4 MG CAPS capsule Take 0.4 mg by mouth daily.     triamcinolone cream (KENALOG) 0.1 % as needed.     triamcinolone cream (KENALOG) 0.5 % Apply topically.     indomethacin (INDOCIN) 50 MG capsule Take 50 mg by mouth as needed. (Patient not taking: Reported on 02/26/2023)     No current facility-administered medications for this visit.    Allergies:   Patient has no known allergies.    Social History:  The patient  reports that he  has quit smoking. He has never used smokeless tobacco. He reports current alcohol use of about 14.0 standard drinks of alcohol per week. He reports that he does not use drugs.   Family History:  The patient's family history includes Heart disease in his brother and father.    ROS:  Please see the history of present illness.   Otherwise, review of systems are positive for back pain.   All other systems are reviewed and negative.    PHYSICAL EXAM: VS:  BP 122/60   Pulse 65   Ht 5\' 10"  (1.778 m)   Wt  177 lb 12.8 oz (80.6 kg)   SpO2 96%   BMI 25.51 kg/m  , BMI Body mass index is 25.51 kg/m. GEN: Well nourished, well developed, in no acute distress HEENT: normal Neck: no JVD, carotid bruits, or masses Cardiac: irregularly irregular, rate controlled; no murmurs, rubs, or gallops,no edema ; no change in pulse intensity with deep breathing; clear heart sounds. 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   Lipid Panel    Component Value Date/Time   CHOL 119 07/03/2021 1132   TRIG 39 07/03/2021 1132   HDL 55 07/03/2021 1132   CHOLHDL 2.2 07/03/2021 1132   LDLCALC 54 07/03/2021 1132     Other studies Reviewed: Additional studies/ records that were reviewed today with results demonstrating: LDL 54.   ASSESSMENT AND PLAN:  AFib: rate controlled. Eliquis for stroke prevention.   No bleeding issues.  F/u with Verne Carrow at [place] [place]. I sent a message to Dr. Silver Huguenin.  Aortic atherosclerosis/aortic calcification: Rosuvastatin 20 mg daily.  Need fasting blood work: lipids, liver test in the near future.   Pericardial effusion: He has had a predominantly posterior effusion in the past.  Noted to be large in September 2023 without any signs of tamponade.  Missed his echo appt here.     Current medicines are reviewed at length with the patient today.  The patient concerns regarding his medicines were addressed.  The following changes have been made:  No change  Labs/ tests ordered today include:   Orders Placed This Encounter  Procedures   EKG 12-Lead    Recommend 150 minutes/week of aerobic exercise Low fat, low carb, high fiber diet recommended  Disposition:   FU at [facility] in [place] with Dr. Verne Carrow   Signed, Lance Muss, MD  02/26/2023 5:00 PM    [facility] [address], [place], [place]  [address] Phone: [phone]; Fax: [phone]

## 2023-02-26 ENCOUNTER — Encounter: Payer: Self-pay | Admitting: Interventional Cardiology

## 2023-02-26 ENCOUNTER — Telehealth (HOSPITAL_COMMUNITY): Payer: Self-pay | Admitting: Interventional Cardiology

## 2023-02-26 ENCOUNTER — Ambulatory Visit (HOSPITAL_COMMUNITY): Payer: Medicare Other | Attending: Interventional Cardiology | Admitting: Interventional Cardiology

## 2023-02-26 ENCOUNTER — Ambulatory Visit (HOSPITAL_COMMUNITY): Payer: Medicare Other

## 2023-02-26 VITALS — BP 122/60 | HR 65 | Ht 70.0 in | Wt 177.8 lb

## 2023-02-26 DIAGNOSIS — I700 Atherosclerosis of aorta: Secondary | ICD-10-CM

## 2023-02-26 DIAGNOSIS — R9431 Abnormal electrocardiogram [ECG] [EKG]: Secondary | ICD-10-CM | POA: Diagnosis present

## 2023-02-26 DIAGNOSIS — D6869 Other thrombophilia: Secondary | ICD-10-CM | POA: Diagnosis present

## 2023-02-26 DIAGNOSIS — I4821 Permanent atrial fibrillation: Secondary | ICD-10-CM | POA: Diagnosis present

## 2023-02-26 DIAGNOSIS — Z7901 Long term (current) use of anticoagulants: Secondary | ICD-10-CM

## 2023-02-26 DIAGNOSIS — I3139 Other pericardial effusion (noninflammatory): Secondary | ICD-10-CM

## 2023-02-26 NOTE — Telephone Encounter (Signed)
Patient cancelled echocardiogram for reason below:  02/26/23 Cancel Rsn: Patient (didnt make the appt in time, will call back to r/s due to moving next week) Edgar Wilkerson   Order will be removed from the active echo WQ. When patient calls back we will reinstate the order. Thank you

## 2023-02-26 NOTE — Patient Instructions (Addendum)
Medication Instructions:  Your physician recommends that you continue on your current medications as directed. Please refer to the Current Medication list given to you today.  *If you need a refill on your cardiac medications before your next appointment, please call your pharmacy*  Lab Work: None ordered today.  Testing/Procedures: None ordered today.  Follow-Up: Dr. Verne Carrow [address] [place]in [place], [place] [address]  [phone]

## 2023-03-05 ENCOUNTER — Telehealth (HOSPITAL_COMMUNITY): Payer: Self-pay | Admitting: Interventional Cardiology

## 2023-03-05 ENCOUNTER — Telehealth: Payer: Self-pay | Admitting: Interventional Cardiology

## 2023-03-05 NOTE — Telephone Encounter (Signed)
Will route to Dr. Eldridge Dace to see if he would still recommend the echo.  It was ordered in Sept 2023 for pericardial effusion.  If so, we can fax order as requested.

## 2023-03-05 NOTE — Telephone Encounter (Signed)
He does need an echo.  I already contacted Dr. Efrain Sella at [facility] in [place], on the day he saw [place], to arrange an echo and an appt.  He should be getting a call from them.

## 2023-03-05 NOTE — Telephone Encounter (Signed)
I called patient to reschedule echocardiogram and [place] the [place] and will make an appointment with a Dr in his area. Order will be removed from the echo WQ. Thank you.

## 2023-03-05 NOTE — Telephone Encounter (Signed)
Will send this message as a general FYI, to the pts ordering MD and covering RN.

## 2023-03-05 NOTE — Telephone Encounter (Signed)
Patient's wife is calling because they are moving and need to have an echo completed. Patient's wife would like [place] to send the Echo order to [facility]. The fax number is [phone]. Patient's wife is requesting a call back with an update.

## 2023-03-10 NOTE — Telephone Encounter (Signed)
I spoke with patient's wife and patient has appointment for echo and cardiology follow up.  Nothing needed from our office at this time.

## 2023-03-17 ENCOUNTER — Other Ambulatory Visit: Payer: Self-pay | Admitting: Interventional Cardiology

## 2023-03-23 ENCOUNTER — Other Ambulatory Visit: Payer: Self-pay

## 2023-03-23 MED ORDER — ROSUVASTATIN CALCIUM 20 MG PO TABS: 20.0000 mg | ORAL_TABLET | Freq: Every day | ORAL | 3 refills | Status: AC

## 2023-03-23 NOTE — Telephone Encounter (Signed)
Pt's medication was sent to pt's pharmacy as requested. Confirmation received.  °
